# Patient Record
Sex: Female | Born: 1975 | Hispanic: No
Health system: Midwestern US, Community
[De-identification: ages and names within clinical notes are randomized; demographics above are authoritative.]

## PROBLEM LIST (undated history)

## (undated) DIAGNOSIS — I10 Essential (primary) hypertension: Secondary | ICD-10-CM

## (undated) DIAGNOSIS — T7840XA Allergy, unspecified, initial encounter: Secondary | ICD-10-CM

## (undated) DIAGNOSIS — J45909 Unspecified asthma, uncomplicated: Secondary | ICD-10-CM

## (undated) HISTORY — DX: Essential (primary) hypertension: I10

## (undated) HISTORY — DX: Unspecified asthma, uncomplicated: J45.909

## (undated) HISTORY — DX: Allergy, unspecified, initial encounter: T78.40XA

---

## 1996-12-16 HISTORY — PX: DILATION AND CURETTAGE OF UTERUS: SHX78

## 2007-02-14 HISTORY — PX: BACK SURGERY: SHX140

## 2014-05-15 HISTORY — PX: KNEE SURGERY: SHX244

## 2015-03-16 HISTORY — PX: BRANCHIAL CLEFT CYST EXCISION: SUR497

## 2016-07-29 ENCOUNTER — Encounter: Admit: 2016-07-29 | Primary: Internal Medicine

## 2016-07-29 ENCOUNTER — Inpatient Hospital Stay
Admit: 2016-07-29 | Discharge: 2016-07-29 | Disposition: A | Discharge status: Home / Self Care | Attending: Emergency Medicine

## 2016-07-29 MED ORDER — TETANUS-DIPHTH-ACELL PERTUSSIS 5-2.5-18.5 LF-MCG/0.5 IM SUSP
Freq: Once | INTRAMUSCULAR | Status: AC
Start: 2016-07-29 — End: 2016-07-29
  Administered 2016-07-29: 14:00:00 0.5 mL via INTRAMUSCULAR

## 2016-07-29 MED ORDER — NAPROXEN 500 MG PO TABS
500 MG | ORAL_TABLET | Freq: Two times a day (BID) | ORAL | 0 refills | Status: AC | PRN
Start: 2016-07-29 — End: 2016-08-08

## 2016-07-29 MED ORDER — CYCLOBENZAPRINE HCL 10 MG PO TABS
10 MG | ORAL_TABLET | Freq: Three times a day (TID) | ORAL | 0 refills | Status: AC | PRN
Start: 2016-07-29 — End: 2016-08-08

## 2016-07-29 MED FILL — BOOSTRIX 5-2.5-18.5 IM SUSP: 5-2.5-18.5 | INTRAMUSCULAR | Qty: 0.5
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
## 2021-05-22 NOTE — Progress Notes (Addendum)
[place] [facility] [place], [place], [place] [address] [phone]  Date:  05/28/2021   Name:  Sandra Hester   DOB:  10-19-1976   MRN:  [id number]  PCP:  Darreld Mclean, MD    Chief Complaint: New Patient (Initial Visit) (Concerns/ questions: /1.Referral to GYN- Dr. Mancel Bale. )   History of Present Illness:  Sandra Hester is a 45 y.o. very pleasant female patient who presents with the following:  New patient here today to establish care Referred to my practice by her mother who is also my patient Moved to this area from [place]- to be closer to her mom   She has vitamin D deficiency and she is taking vit D- feels better since taking this  Also history of insomnia  She is a safety and compliance auditor for manufacturing Her most recent labs were done in the fall  Her grandfather had colon cancer in his 77s She would like to do colonoscopy after discussion of options   She notes that her weight is a bit higher than she would like She has noted some insomnia and thinning hair  She notes that she generally only sleeps 4-5 hours a day -this has been a chronic issue for her.  She was given a prescription for Ambien in the past, but does not want to use it for fear of it being too strong  She is still menstruating with some mild irregularity She has pain and heavy bleeding during her menses She would like a referral to see Dr Mancel Bale for GYN care -I advised her that Dr. 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 Tonie Griffith Asgher is her previous PCP, will request records No reviews  Family practice physician [address]  [phone] There are no problems to display for this patient.   Past Medical History:  Diagnosis Date   Allergy    Asthma    Hypertension     Social History   Tobacco Use    Smoking status: Unknown  Vaping Use   Vaping Use: Never used  Substance Use Topics   Alcohol use: Not Currently   Drug use: Never    Family History  Problem Relation Age of Onset   Stroke Mother    Hyperlipidemia Mother    Cancer Mother    Arthritis Mother    Heart disease Father    Cancer Father    Miscarriages / Stillbirths Sister    Asthma Sister    Heart disease Son        SVT   Mental illness Son     Not on File  Medication list has been reviewed and updated.  Current Outpatient Medications on File Prior to Visit  Medication Sig Dispense Refill   BIOTIN PO Take by mouth.     Cholecalciferol (VITAMIN D3 PO) Take by mouth.     Collagen-Boron-Hyaluronic Acid (MOVE FREE ULTRA JOINT HEALTH PO) Take by mouth.     ELDERBERRY PO Take by mouth.     fluticasone (FLONASE) 50 MCG/ACT nasal spray Place into both nostrils daily.     Prenatal Vit-Fe Fumarate-FA (M-VIT) tablet Take 1 tablet by mouth daily.     vitamin C (ASCORBIC ACID) 250 MG tablet Take 250 mg by mouth daily.     No current facility-administered medications on file  prior to visit.    Review of Systems:  As per HPI- otherwise negative.   Physical Examination: Vitals:   05/28/21 1111  BP: 110/60  Pulse: 88  Resp: 18  Temp: 97.9 F (36.6 C)  SpO2: 98%   Vitals:   05/28/21 1111  Weight: 213 lb (96.6 kg)  Height: 5\' 7"  (1.702 m)   Body mass index is 33.36 kg/m. Ideal Body Weight: Weight in (lb) to have BMI = 25: 159.3  GEN: no acute distress.  Obese, otherwise looks well HEENT: Atraumatic, Normocephalic.  Bilateral TM wnl, oropharynx normal.  PEERL,EOMI.   Ears and Nose: No external deformity. CV: RRR, No M/G/R. No JVD. No thrill. No extra heart sounds. PULM: CTA B, no wheezes, crackles, rhonchi. No retractions. No resp. distress. No accessory muscle use. ABD: S, NT, ND, +BS. No rebound. No HSM. EXTR: No c/c/e PSYCH: Normally interactive. Conversant.    Assessment and Plan: Vitamin D  deficiency - Plan: VITAMIN D 25 Hydroxy (Vit-D Deficiency, Fractures)  Screening for diabetes mellitus - Plan: Comprehensive metabolic panel, Hemoglobin A1c  Screening for hyperlipidemia - Plan: Lipid panel  Screening for thyroid disorder - Plan: TSH  Screening for colon cancer - Plan: Ambulatory referral to Gastroenterology  Screening for cervical cancer - Plan: Ambulatory referral to Obstetrics / Gynecology  Screening for deficiency anemia - Plan: CBC  Primary insomnia - Plan: traZODone (DESYREL) 50 MG tablet  Menstrual irregularity - Plan: [facility]  Establishing care today She would like to try trazodone for insomnia, she will let me know how this works for Referral to GI for colonoscopy, to GYN to establish care She wonders if she may be perimenopausal, will check [facility] Will plan further follow- up pending labs.  This visit occurred during the SARS-CoV-2 public health emergency.  Safety protocols were in place, including screening questions prior to the visit, additional usage of staff PPE, and extensive cleaning of exam room while observing appropriate contact time as indicated for disinfecting solutions. ' Signed Lamar Blinks, MD  Received her labs as below, message to pt  Results for orders placed or performed in visit on 05/28/21  CBC  Result Value Ref Range   WBC 9.6 4.0 - 10.5 K/uL   RBC 5.05 3.87 - 5.11 Mil/uL   Platelets 214.0 150.0 - 400.0 K/uL   Hemoglobin 15.0 12.0 - 15.0 g/dL   HCT 43.6 36.0 - 46.0 %   MCV 86.2 78.0 - 100.0 fl   MCHC 34.4 30.0 - 36.0 g/dL   RDW 14.0 11.5 - 15.5 %  Comprehensive metabolic panel  Result Value Ref Range   Sodium 139 135 - 145 mEq/L   Potassium 4.3 3.5 - 5.1 mEq/L   Chloride 104 96 - 112 mEq/L   CO2 27 19 - 32 mEq/L   Glucose, Bld 89 70 - 99 mg/dL   BUN 10 6 - 23 mg/dL   Creatinine, Ser 0.87 0.40 - 1.20 mg/dL   Total Bilirubin 0.5 0.2 - 1.2 mg/dL   Alkaline Phosphatase 94 39 - 117 U/L   AST 12 0 - 37 U/L   ALT 12 0 - 35  U/L   Total Protein 7.0 6.0 - 8.3 g/dL   Albumin 4.4 3.5 - 5.2 g/dL   GFR 79.95 >60.00 mL/min   Calcium 9.4 8.4 - 10.5 mg/dL  Hemoglobin A1c  Result Value Ref Range   Hgb A1c MFr Bld 5.5 4.6 - 6.5 %  Lipid panel  Result Value Ref Range   Cholesterol  181 0 - 200 mg/dL   Triglycerides 192.0 (H) 0.0 - 149.0 mg/dL   HDL 35.00 (L) >39.00 mg/dL   VLDL 38.4 0.0 - 40.0 mg/dL   LDL Cholesterol 108 (H) 0 - 99 mg/dL   Total CHOL/HDL Ratio 5    NonHDL 146.32   TSH  Result Value Ref Range   TSH 3.08 0.35 - 5.50 uIU/mL  VITAMIN D 25 Hydroxy (Vit-D Deficiency, Fractures)  Result Value Ref Range   VITD 97.84 30.00 - 100.00 ng/mL  [facility]  Result Value Ref Range   FSH 4.1 mIU/ML

## 2021-05-28 ENCOUNTER — Other Ambulatory Visit: Payer: Self-pay

## 2021-05-28 ENCOUNTER — Ambulatory Visit: Payer: BC Managed Care – PPO | Admitting: Family Medicine

## 2021-05-28 ENCOUNTER — Encounter: Payer: Self-pay | Admitting: Family Medicine

## 2021-05-28 VITALS — BP 110/60 | HR 88 | Temp 97.9°F | Resp 18 | Ht 67.0 in | Wt 213.0 lb

## 2021-05-28 DIAGNOSIS — E559 Vitamin D deficiency, unspecified: Secondary | ICD-10-CM

## 2021-05-28 DIAGNOSIS — N926 Irregular menstruation, unspecified: Secondary | ICD-10-CM

## 2021-05-28 DIAGNOSIS — Z124 Encounter for screening for malignant neoplasm of cervix: Secondary | ICD-10-CM

## 2021-05-28 DIAGNOSIS — Z1329 Encounter for screening for other suspected endocrine disorder: Secondary | ICD-10-CM | POA: Diagnosis not present

## 2021-05-28 DIAGNOSIS — F5101 Primary insomnia: Secondary | ICD-10-CM

## 2021-05-28 DIAGNOSIS — Z13220 Encounter for screening for lipoid disorders: Secondary | ICD-10-CM

## 2021-05-28 DIAGNOSIS — Z130 Encounter for screening for diseases of the blood and blood-forming organs and certain disorders involving the immune mechanism: Secondary | ICD-10-CM | POA: Diagnosis not present

## 2021-05-28 DIAGNOSIS — Z1211 Encounter for screening for malignant neoplasm of colon: Secondary | ICD-10-CM

## 2021-05-28 DIAGNOSIS — Z131 Encounter for screening for diabetes mellitus: Secondary | ICD-10-CM | POA: Diagnosis not present

## 2021-05-28 LAB — TSH: TSH: 3.08 u[IU]/mL (ref 0.35–5.50)

## 2021-05-28 LAB — CBC
HCT: 43.6 % (ref 36.0–46.0)
Hemoglobin: 15 g/dL (ref 12.0–15.0)
MCHC: 34.4 g/dL (ref 30.0–36.0)
MCV: 86.2 fl (ref 78.0–100.0)
Platelets: 214 10*3/uL (ref 150.0–400.0)
RBC: 5.05 Mil/uL (ref 3.87–5.11)
RDW: 14 % (ref 11.5–15.5)
WBC: 9.6 10*3/uL (ref 4.0–10.5)

## 2021-05-28 LAB — VITAMIN D 25 HYDROXY (VIT D DEFICIENCY, FRACTURES): VITD: 97.84 ng/mL (ref 30.00–100.00)

## 2021-05-28 LAB — COMPREHENSIVE METABOLIC PANEL
ALT: 12 U/L (ref 0–35)
AST: 12 U/L (ref 0–37)
Albumin: 4.4 g/dL (ref 3.5–5.2)
Alkaline Phosphatase: 94 U/L (ref 39–117)
BUN: 10 mg/dL (ref 6–23)
CO2: 27 mEq/L (ref 19–32)
Calcium: 9.4 mg/dL (ref 8.4–10.5)
Chloride: 104 mEq/L (ref 96–112)
Creatinine, Ser: 0.87 mg/dL (ref 0.40–1.20)
GFR: 79.95 mL/min (ref >60.00)
Glucose, Bld: 89 mg/dL (ref 70–99)
Potassium: 4.3 mEq/L (ref 3.5–5.1)
Sodium: 139 mEq/L (ref 135–145)
Total Bilirubin: 0.5 mg/dL (ref 0.2–1.2)
Total Protein: 7 g/dL (ref 6.0–8.3)

## 2021-05-28 LAB — FOLLICLE STIMULATING HORMONE: FSH: 4.1 m[IU]/mL

## 2021-05-28 LAB — LIPID PANEL
Cholesterol: 181 mg/dL (ref 0–200)
HDL: 35 mg/dL — ABNORMAL LOW (ref >39.00)
LDL Cholesterol: 108 mg/dL — ABNORMAL HIGH (ref 0–99)
NonHDL: 146.32
Total CHOL/HDL Ratio: 5
Triglycerides: 192 mg/dL — ABNORMAL HIGH (ref 0.0–149.0)
VLDL: 38.4 mg/dL (ref 0.0–40.0)

## 2021-05-28 LAB — HEMOGLOBIN A1C: Hgb A1c MFr Bld: 5.5 % (ref 4.6–6.5)

## 2021-05-28 MED ORDER — TRAZODONE HCL 50 MG PO TABS: 25.0000 mg | ORAL_TABLET | Freq: Every evening | ORAL | 3 refills | Status: DC | PRN

## 2021-05-28 NOTE — Patient Instructions (Signed)
Good to meet you today- I will be in touch with your labs -referral to GYN -referral to GI to discuss colonoscopy  Try trazodone for sleep- let me know if this helps you!  Will plan further follow- up pending labs.  We will request records from Dr Asgher
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
## 2021-06-24 ENCOUNTER — Ambulatory Visit: Payer: BC Managed Care – PPO | Admitting: Family Medicine

## 2021-06-24 ENCOUNTER — Other Ambulatory Visit: Payer: Self-pay

## 2021-06-24 VITALS — BP 126/82 | HR 94 | Temp 97.4°F | Resp 16 | Ht 67.0 in | Wt 215.0 lb

## 2021-06-24 DIAGNOSIS — M26621 Arthralgia of right temporomandibular joint: Secondary | ICD-10-CM | POA: Diagnosis not present

## 2021-06-24 DIAGNOSIS — M542 Cervicalgia: Secondary | ICD-10-CM | POA: Diagnosis not present

## 2021-06-24 MED ORDER — PREDNISONE 20 MG PO TABS: ORAL_TABLET | ORAL | 0 refills | Status: DC

## 2021-06-24 NOTE — Patient Instructions (Addendum)
Good to see you today- it looks like you have TMJ pain Use the prednisone as directed- if you cannot tolerate stop using it!  Try to avoid opening your mouth too wide and stick to easy to chew, softer foods You might also try using some ice over the painful joint Let me know if you are not doing better in a week or so-Sooner if worse.

## 2021-06-30 ENCOUNTER — Encounter: Payer: Self-pay | Admitting: Gastroenterology

## 2021-07-28 ENCOUNTER — Other Ambulatory Visit: Payer: Self-pay

## 2021-07-28 ENCOUNTER — Ambulatory Visit (AMBULATORY_SURGERY_CENTER): Payer: BC Managed Care – PPO

## 2021-07-28 VITALS — Ht 67.0 in | Wt 220.0 lb

## 2021-07-28 DIAGNOSIS — Z1231 Encounter for screening mammogram for malignant neoplasm of breast: Secondary | ICD-10-CM | POA: Diagnosis not present

## 2021-07-28 DIAGNOSIS — Z124 Encounter for screening for malignant neoplasm of cervix: Secondary | ICD-10-CM | POA: Diagnosis not present

## 2021-07-28 DIAGNOSIS — N920 Excessive and frequent menstruation with regular cycle: Secondary | ICD-10-CM | POA: Diagnosis not present

## 2021-07-28 DIAGNOSIS — N926 Irregular menstruation, unspecified: Secondary | ICD-10-CM | POA: Diagnosis not present

## 2021-07-28 DIAGNOSIS — Z1211 Encounter for screening for malignant neoplasm of colon: Secondary | ICD-10-CM

## 2021-07-28 DIAGNOSIS — Z01419 Encounter for gynecological examination (general) (routine) without abnormal findings: Secondary | ICD-10-CM | POA: Diagnosis not present

## 2021-07-28 MED ORDER — SUTAB 1479-225-188 MG PO TABS: 12.0000 | ORAL_TABLET | ORAL | 0 refills | Status: DC

## 2021-07-28 NOTE — Progress Notes (Signed)

## 2021-08-06 ENCOUNTER — Encounter: Payer: Self-pay | Admitting: Gastroenterology

## 2021-08-17 ENCOUNTER — Encounter: Payer: Self-pay | Admitting: Gastroenterology

## 2021-08-17 ENCOUNTER — Ambulatory Visit: Payer: BC Managed Care – PPO | Admitting: Gastroenterology

## 2021-08-17 ENCOUNTER — Other Ambulatory Visit: Payer: Self-pay

## 2021-08-17 VITALS — BP 122/78 | HR 68 | Temp 98.6°F | Resp 13 | Ht 67.0 in | Wt 220.0 lb

## 2021-08-17 DIAGNOSIS — Z1211 Encounter for screening for malignant neoplasm of colon: Secondary | ICD-10-CM | POA: Diagnosis not present

## 2021-08-17 DIAGNOSIS — D122 Benign neoplasm of ascending colon: Secondary | ICD-10-CM

## 2021-08-17 DIAGNOSIS — D125 Benign neoplasm of sigmoid colon: Secondary | ICD-10-CM

## 2021-08-17 DIAGNOSIS — K635 Polyp of colon: Secondary | ICD-10-CM | POA: Diagnosis not present

## 2021-08-17 MED ORDER — SODIUM CHLORIDE 0.9 % IV SOLN
500.0000 mL | Freq: Once | INTRAVENOUS | Status: DC
Start: 2021-08-17 — End: 2021-08-17

## 2021-08-17 NOTE — Patient Instructions (Signed)
Handouts given for polyps and diverticulosis.  YOU HAD AN ENDOSCOPIC PROCEDURE TODAY AT THE [facility]:   Refer to the procedure report that was given to you for any specific questions about what was found during the examination.  If the procedure report does not answer your questions, please call your gastroenterologist to clarify.  If you requested that your care partner not be given the details of your procedure findings, then the procedure report has been included in a sealed envelope for you to review at your convenience later.  YOU SHOULD EXPECT: Some feelings of bloating in the abdomen. Passage of more gas than usual.  Walking can help get rid of the air that was put into your GI tract during the procedure and reduce the bloating. If you had a lower endoscopy (such as a colonoscopy or flexible sigmoidoscopy) you may notice spotting of blood in your stool or on the toilet paper. If you underwent a bowel prep for your procedure, you may not have a normal bowel movement for a few days.  Please Note:  You might notice some irritation and congestion in your nose or some drainage.  This is from the oxygen used during your procedure.  There is no need for concern and it should clear up in a day or so.  SYMPTOMS TO REPORT IMMEDIATELY:  Following lower endoscopy (colonoscopy or flexible sigmoidoscopy):  Excessive amounts of blood in the stool  Significant tenderness or worsening of abdominal pains  Swelling of the abdomen that is new, acute  Fever of 100F or higher  For urgent or emergent issues, a gastroenterologist can be reached at any hour by calling [phone]. Do not use MyChart messaging for urgent concerns.    DIET:  We do recommend a small meal at first, but then you may proceed to your regular diet.  Drink plenty of fluids but you should avoid alcoholic beverages for 24 hours.  ACTIVITY:  You should plan to take it easy for the rest of today and you should NOT DRIVE  or use heavy machinery until tomorrow (because of the sedation medicines used during the test).    FOLLOW UP: Our staff will call the number listed on your records 48-72 hours following your procedure to check on you and address any questions or concerns that you may have regarding the information given to you following your procedure. If we do not reach you, we will leave a message.  We will attempt to reach you two times.  During this call, we will ask if you have developed any symptoms of COVID 19. If you develop any symptoms (ie: fever, flu-like symptoms, shortness of breath, cough etc.) before then, please call [phone].  If you test positive for Covid 19 in the 2 weeks Gauss procedure, please call and report this information to us.    If any biopsies were taken you will be contacted by phone or by letter within the next 1-3 weeks.  Please call us at [phone] if you have not heard about the biopsies in 3 weeks.    SIGNATURES/CONFIDENTIALITY: You and/or your care partner have signed paperwork which will be entered into your electronic medical record.  These signatures attest to the fact that that the information above on your After Visit Summary has been reviewed and is understood.  Full responsibility of the confidentiality of this discharge information lies with you and/or your care-partner.  

## 2021-08-17 NOTE — Progress Notes (Signed)
Sedate, gd SR, tolerated procedure well, VSS, report to RN 

## 2021-08-17 NOTE — Progress Notes (Signed)
Referring Provider: Darreld Mclean, MD Primary Care Physician:  Darreld Mclean, MD  Reason for Procedure:  Colon cancer screening   IMPRESSION:  Need for colon cancer screening Mother with colon polyps Maternal grandfather with colon cancer Appropriate candidate for monitored anesthesia care  PLAN: Colonoscopy in the [facility] today   HPI: Sandra Hester is a 45 y.o. female presents for screening colonoscopy.  No prior colonoscopy or colon cancer screening.  No baseline GI symptoms.   Mother with colon polyps. Maternal grandfather with colon cancer in his 23s. No known family history of colon cancer or polyps. No family history of uterine/endometrial cancer, pancreatic cancer or gastric/stomach cancer.   Past Medical History:  Diagnosis Date   Allergy    Asthma    Hypertension     Past Surgical History:  Procedure Laterality Date   BACK SURGERY  02/2007   Plate and 2 screws S5/K5   BRANCHIAL CLEFT CYST EXCISION  03/2015   Two more removed: 09/2020, 03/2015   DILATION AND CURETTAGE OF UTERUS  12/1996   KNEE SURGERY Right 05/2014   Torn Meniscus    Current Outpatient Medications  Medication Sig Dispense Refill   BIOTIN PO Take by mouth.     Cholecalciferol (VITAMIN D3 PO) Take by mouth.     Collagen-Boron-Hyaluronic Acid (MOVE FREE ULTRA JOINT HEALTH PO) Take by mouth.     ELDERBERRY PO Take by mouth.     fluticasone (FLONASE) 50 MCG/ACT nasal spray Place into both nostrils daily.     Prenatal Vit-Fe Fumarate-FA (M-VIT) tablet Take 1 tablet by mouth daily.     vitamin C (ASCORBIC ACID) 250 MG tablet Take 250 mg by mouth daily.     traZODone (DESYREL) 50 MG tablet Take 0.5-1 tablets (25-50 mg total) by mouth at bedtime as needed for sleep. (Patient not taking: No sig reported) 30 tablet 3   Current Facility-Administered Medications  Medication Dose Route Frequency Provider Last Rate Last Admin   0.9 %  sodium chloride infusion  500 mL Intravenous Once Thornton Park, MD        Allergies as of 08/17/2021   (No Known Allergies)    Family History  Problem Relation Age of Onset   Stroke Mother    Hyperlipidemia Mother    Cancer Mother    Arthritis Mother    Heart disease Father    Cancer Father    6 / Stillbirths Sister    Asthma Sister    Esophageal cancer Maternal Grandmother    Colon cancer Paternal Grandfather    Heart disease Son        SVT   Mental illness Son    Colon polyps Neg Hx    Rectal cancer Neg Hx    Stomach cancer Neg Hx      Physical Exam: General:   Alert,  well-nourished, pleasant and cooperative in NAD Head:  Normocephalic and atraumatic. Eyes:  Sclera clear, no icterus.   Conjunctiva pink. Mouth:  No deformity or lesions.   Neck:  Supple; no masses or thyromegaly. Lungs:  Clear throughout to auscultation.   No wheezes. Heart:  Regular rate and rhythm; no murmurs. Abdomen:  Soft, non-tender, nondistended, normal bowel sounds, no rebound or guarding.  Msk:  Symmetrical. No boney deformities LAD: No inguinal or umbilical LAD Extremities:  No clubbing or edema. Neurologic:  Alert and  oriented x4;  grossly nonfocal Skin:  No obvious rash or bruise. Psych:  Alert and cooperative. Normal mood  and affect.     Studies/Results: No results found.    Sandra Delcastillo L. Tarri Glenn, MD, MPH 08/17/2021, 8:48 AM

## 2021-08-17 NOTE — Progress Notes (Signed)
VS by CW  I have reviewed the patient's medical history in detail and updated the computerized patient record.  

## 2021-08-17 NOTE — Op Note (Signed)
[facility] Patient Name: Sandra Hester Procedure Date: 08/17/2021 8:13 AM MRN: [id number] Endoscopist: Thornton Park MD, MD Age: 45 Referring MD:  Date of Birth: [birth year] Gender: Female Account #: [id number] Procedure:                Colonoscopy Indications:              Screening for colorectal malignant neoplasm, This                            is the patient's first colonoscopy                           Mother with colon polyps                           Maternal grandfather with colon cancer in his 54s Medicines:                Monitored Anesthesia Care Procedure:                Pre-Anesthesia Assessment:                           - Prior to the procedure, a History and Physical                            was performed, and patient medications and                            allergies were reviewed. The patient's tolerance of                            previous anesthesia was also reviewed. The risks                            and benefits of the procedure and the sedation                            options and risks were discussed with the patient.                            All questions were answered, and informed consent                            was obtained. Prior Anticoagulants: The patient has                            taken no previous anticoagulant or antiplatelet                            agents. ASA Grade Assessment: II - A patient with                            mild systemic disease. After reviewing the risks  and benefits, the patient was deemed in                            satisfactory condition to undergo the procedure.                           After obtaining informed consent, the colonoscope                            was passed under direct vision. Throughout the                            procedure, the patient's blood pressure, pulse, and                            oxygen saturations were monitored continuously. The                             CF [id number] #[id number] was introduced through the anus                            and advanced to the the terminal ileum. A second                            forward view of the right colon was performed. The                            colonoscopy was performed with moderate difficulty                            due to significant looping. Successful completion                            of the procedure was aided by applying abdominal                            pressure. The patient tolerated the procedure well.                            The quality of the bowel preparation was excellent.                            The terminal ileum, ileocecal valve, appendiceal                            orifice, and rectum were photographed. Scope In: 8:22:45 AM Scope Out: 8:36:56 AM Scope Withdrawal Time: 0 hours 10 minutes 28 seconds  Total Procedure Duration: 0 hours 14 minutes 11 seconds  Findings:                 Hemorrhoids were found on perianal exam.                           Many small and large-mouthed diverticula were found  in the sigmoid colon and descending colon.                           A 3 mm polyp was found in the sigmoid colon. The                            polyp was flat. The polyp was removed with a cold                            snare. Resection and retrieval were complete.                            Estimated blood loss was minimal.                           Four sessile polyps were found in the ascending                            colon. The polyps were 2 to 8 mm in size. These                            polyps were removed with a cold snare. Resection                            and retrieval were complete. Estimated blood loss                            was minimal.                           The exam was otherwise without abnormality on                            direct and retroflexion views. Complications:            No  immediate complications. Estimated blood loss:                            Minimal. Estimated Blood Loss:     Estimated blood loss was minimal. Impression:               - Hemorrhoids found on perianal exam.                           - Diverticulosis in the sigmoid colon and in the                            descending colon.                           - One 3 mm polyp in the sigmoid colon, removed with                            a cold snare. Resected and retrieved.                           -  Four 2 to 8 mm polyps in the ascending colon,                            removed with a cold snare. Resected and retrieved.                           - The examination was otherwise normal on direct                            and retroflexion views. Recommendation:           - Patient has a contact number available for                            emergencies. The signs and symptoms of potential                            delayed complications were discussed with the                            patient. Return to normal activities tomorrow.                            Written discharge instructions were provided to the                            patient.                           - Resume previous diet.                           - Continue present medications.                           - Await pathology results.                           - Repeat colonoscopy date to be determined after                            pending pathology results are reviewed for                            surveillance.                           - Emerging evidence supports eating a diet of                            fruits, vegetables, grains, calcium, and yogurt                            while reducing red meat and alcohol may reduce the  risk of colon cancer.                           - Thank you for allowing me to be involved in your                            colon cancer prevention. Thornton Park MD, MD 08/17/2021 8:44:55 AM This report has been signed electronically.

## 2021-08-18 ENCOUNTER — Other Ambulatory Visit: Payer: Self-pay | Admitting: Obstetrics and Gynecology

## 2021-08-18 DIAGNOSIS — N920 Excessive and frequent menstruation with regular cycle: Secondary | ICD-10-CM | POA: Diagnosis not present

## 2021-08-19 ENCOUNTER — Telehealth: Payer: Self-pay

## 2021-08-19 NOTE — Telephone Encounter (Signed)
  Follow up Call-  Call back number 08/17/2021  [facility] procedure Call Back phone  # [phone]  Permission to leave phone message Yes     Patient questions:  Do you have a fever, pain , or abdominal swelling? No. Pain Score  0 *  Have you tolerated food without any problems? Yes.    Have you been able to return to your normal activities? Yes.    Do you have any questions about your discharge instructions: Diet   No. Medications  No. Follow up visit  No.  Do you have questions or concerns about your Care? No.  Actions: * If pain score is 4 or above: No action needed, pain <4.

## 2021-08-20 ENCOUNTER — Encounter: Payer: Self-pay | Admitting: Gastroenterology

## 2021-09-02 DIAGNOSIS — N920 Excessive and frequent menstruation with regular cycle: Secondary | ICD-10-CM | POA: Diagnosis not present

## 2021-09-02 DIAGNOSIS — N946 Dysmenorrhea, unspecified: Secondary | ICD-10-CM | POA: Diagnosis not present

## 2021-09-02 DIAGNOSIS — D252 Subserosal leiomyoma of uterus: Secondary | ICD-10-CM | POA: Diagnosis not present

## 2021-11-04 DIAGNOSIS — J209 Acute bronchitis, unspecified: Secondary | ICD-10-CM | POA: Diagnosis not present

## 2021-11-04 DIAGNOSIS — Z20822 Contact with and (suspected) exposure to covid-19: Secondary | ICD-10-CM | POA: Diagnosis not present

## 2021-11-16 ENCOUNTER — Encounter: Payer: Self-pay | Admitting: Family Medicine

## 2021-11-16 DIAGNOSIS — G8929 Other chronic pain: Secondary | ICD-10-CM

## 2021-11-19 ENCOUNTER — Other Ambulatory Visit: Payer: Self-pay | Admitting: Family

## 2021-11-19 ENCOUNTER — Encounter: Payer: Self-pay | Admitting: Family

## 2021-11-19 ENCOUNTER — Ambulatory Visit (INDEPENDENT_AMBULATORY_CARE_PROVIDER_SITE_OTHER)
Admission: RE | Admit: 2021-11-19 | Discharge: 2021-11-19 | Disposition: A | Discharge status: Home / Self Care | Payer: BC Managed Care – PPO | Source: Ambulatory Visit | Attending: Family | Admitting: Family

## 2021-11-19 ENCOUNTER — Ambulatory Visit: Payer: BC Managed Care – PPO | Admitting: Family

## 2021-11-19 ENCOUNTER — Other Ambulatory Visit: Payer: Self-pay

## 2021-11-19 VITALS — BP 122/100 | HR 83 | Temp 98.1°F | Ht 67.0 in | Wt 219.0 lb

## 2021-11-19 DIAGNOSIS — M2578 Osteophyte, vertebrae: Secondary | ICD-10-CM | POA: Diagnosis not present

## 2021-11-19 DIAGNOSIS — M545 Low back pain, unspecified: Secondary | ICD-10-CM

## 2021-11-19 MED ORDER — METHOCARBAMOL 500 MG PO TABS: 500.0000 mg | ORAL_TABLET | Freq: Every evening | ORAL | 0 refills | Status: DC | PRN

## 2021-11-19 MED ORDER — METHYLPREDNISOLONE ACETATE 40 MG/ML IJ SUSP
40.0000 mg | Freq: Once | INTRAMUSCULAR | Status: AC
  Administered 2021-11-19: 40 mg via INTRAMUSCULAR

## 2021-11-19 MED ORDER — MELOXICAM 15 MG PO TABS: 15.0000 mg | ORAL_TABLET | Freq: Every day | ORAL | 0 refills | Status: DC

## 2021-11-19 NOTE — Progress Notes (Signed)
Sandra Hester is a 46 y.o. female with the following history as recorded in Epic[employer]:  There are no problems to display for this patient.   Current Outpatient Medications  Medication Sig Dispense Refill   BIOTIN PO Take by mouth.     Cholecalciferol (VITAMIN D3 PO) Take by mouth.     Collagen-Boron-Hyaluronic Acid (MOVE FREE ULTRA JOINT HEALTH PO) Take by mouth.     ELDERBERRY PO Take by mouth.     fluticasone (FLONASE) 50 MCG/ACT nasal spray Place into both nostrils daily.     Prenatal Vit-Fe Fumarate-FA (M-VIT) tablet Take 1 tablet by mouth daily.     traZODone (DESYREL) 50 MG tablet Take 0.5-1 tablets (25-50 mg total) by mouth at bedtime as needed for sleep. 30 tablet 3   vitamin C (ASCORBIC ACID) 250 MG tablet Take 250 mg by mouth daily.     albuterol (VENTOLIN HFA) 108 (90 Base) MCG/ACT inhaler Inhale 2 puffs into the lungs every 4 (four) hours as needed.     benzonatate (TESSALON) 200 MG capsule Take by mouth.     Current Facility-Administered Medications  Medication Dose Route Frequency Provider Last Rate Last Admin   methylPREDNISolone acetate (DEPO-MEDROL) injection 40 mg  40 mg Intramuscular Once Marrian Salvage, FNP        Allergies: Patient has no known allergies.  Past Medical History:  Diagnosis Date   Allergy    Asthma    Hypertension     Past Surgical History:  Procedure Laterality Date   BACK SURGERY  02/2007   Plate and 2 screws Z6/X0   BRANCHIAL CLEFT CYST EXCISION  03/2015   Two more removed: 09/2020, 03/2015   DILATION AND CURETTAGE OF UTERUS  12/1996   KNEE SURGERY Right 05/2014   Torn Meniscus    Family History  Problem Relation Age of Onset   Stroke Mother    Hyperlipidemia Mother    Cancer Mother    Arthritis Mother    Heart disease Father    Cancer Father    52 / Stillbirths Sister    Asthma Sister    Esophageal cancer Maternal Grandmother    Colon cancer Paternal Grandfather    Heart disease Son        SVT   Mental illness  Son    Colon polyps Neg Hx    Rectal cancer Neg Hx    Stomach cancer Neg Hx     Social History   Tobacco Use   Smoking status: Every Day    Types: Cigarettes   Smokeless tobacco: Never  Substance Use Topics   Alcohol use: Not Currently    Subjective:   Complaining of back pain that started 3 days ago while bending over; notes that pain is localized to area of her back - denies any numbness or tingling radiating into her lower extremities.  Has high pain tolerance; has been using OTC Advil with limited relief; no concerns for changes in bowel/ bladder habits; does feel that symptoms have improved slightly in past 24 hours;   LMP 11/19/2021  Objective:  Vitals:   11/19/21 1302  BP: (!) 122/100  Pulse: 83  Temp: 98.1 F (36.7 C)  TempSrc: Oral  SpO2: 96%  Weight: 219 lb (99.3 kg)  Height: 5\' 7"  (1.702 m)    General: Well developed, well nourished, in no acute distress  Skin : Warm and dry.  Head: Normocephalic and atraumatic  Eyes: Sclera and conjunctiva clear; pupils round and reactive to light;  extraocular movements intact  Ears: External normal; canals clear; tympanic membranes normal  Oropharynx: Pink, supple. No suspicious lesions  Neck: Supple without thyromegaly, adenopathy  Lungs: Respirations unlabored;  Musculoskeletal: No deformities; no active joint inflammation  Extremities: No edema, cyanosis, clubbing  Vessels: Symmetric bilaterally  Neurologic: Alert and oriented; speech intact; face symmetrical; moves all extremities well; CNII-XII intact without focal deficit   Assessment:  1. Acute right-sided low back pain without sciatica     Plan:  Update lumbar X-ray; Depo-Medrol IM 40 mg daily; assuming X-ray is normal, will plan to call in Mobic and Robaxin for patient; to consider sports medicine referral as well;   This visit occurred during the SARS-CoV-2 public health emergency.  Safety protocols were in place, including screening questions prior to the  visit, additional usage of staff PPE, and extensive cleaning of exam room while observing appropriate contact time as indicated for disinfecting solutions.    No follow-ups on file.  Orders Placed This Encounter  Procedures   DG Lumbar Spine Complete    Standing Status:   Future    Standing Expiration Date:   11/19/2022    Order Specific Question:   Reason for Exam (SYMPTOM  OR DIAGNOSIS REQUIRED)    Answer:   low back pain    Order Specific Question:   Is patient pregnant?    Answer:   No    Order Specific Question:   Preferred imaging location?    Answer:   [facility]    Requested Prescriptions    No prescriptions requested or ordered in this encounter

## 2021-11-19 NOTE — Patient Instructions (Addendum)
Please go to [place], [place] to get your X-ray done; they are open from 8 am - 5 pm

## 2021-11-20 ENCOUNTER — Other Ambulatory Visit: Payer: Self-pay | Admitting: Family

## 2021-11-20 DIAGNOSIS — G8929 Other chronic pain: Secondary | ICD-10-CM

## 2021-12-08 NOTE — Addendum Note (Signed)
Addended by: Lamar Blinks C on: 12/08/2021 02:28 PM   Modules accepted: Orders

## 2022-02-14 ENCOUNTER — Other Ambulatory Visit: Payer: Self-pay | Admitting: Family

## 2022-03-09 DIAGNOSIS — M5451 Vertebrogenic low back pain: Secondary | ICD-10-CM | POA: Diagnosis not present

## 2022-03-29 DIAGNOSIS — M5451 Vertebrogenic low back pain: Secondary | ICD-10-CM | POA: Diagnosis not present

## 2022-03-29 DIAGNOSIS — M5431 Sciatica, right side: Secondary | ICD-10-CM | POA: Diagnosis not present

## 2022-03-29 DIAGNOSIS — M2560 Stiffness of unspecified joint, not elsewhere classified: Secondary | ICD-10-CM | POA: Diagnosis not present

## 2022-03-29 DIAGNOSIS — M6281 Muscle weakness (generalized): Secondary | ICD-10-CM | POA: Diagnosis not present

## 2022-04-02 DIAGNOSIS — M6281 Muscle weakness (generalized): Secondary | ICD-10-CM | POA: Diagnosis not present

## 2022-04-02 DIAGNOSIS — M2560 Stiffness of unspecified joint, not elsewhere classified: Secondary | ICD-10-CM | POA: Diagnosis not present

## 2022-04-02 DIAGNOSIS — M5451 Vertebrogenic low back pain: Secondary | ICD-10-CM | POA: Diagnosis not present

## 2022-04-02 DIAGNOSIS — M5431 Sciatica, right side: Secondary | ICD-10-CM | POA: Diagnosis not present

## 2022-04-23 DIAGNOSIS — M5431 Sciatica, right side: Secondary | ICD-10-CM | POA: Diagnosis not present

## 2022-04-23 DIAGNOSIS — M5451 Vertebrogenic low back pain: Secondary | ICD-10-CM | POA: Diagnosis not present

## 2022-04-23 DIAGNOSIS — M2560 Stiffness of unspecified joint, not elsewhere classified: Secondary | ICD-10-CM | POA: Diagnosis not present

## 2022-04-23 DIAGNOSIS — M6281 Muscle weakness (generalized): Secondary | ICD-10-CM | POA: Diagnosis not present

## 2022-04-26 DIAGNOSIS — M5431 Sciatica, right side: Secondary | ICD-10-CM | POA: Diagnosis not present

## 2022-04-26 DIAGNOSIS — M2560 Stiffness of unspecified joint, not elsewhere classified: Secondary | ICD-10-CM | POA: Diagnosis not present

## 2022-04-26 DIAGNOSIS — M5451 Vertebrogenic low back pain: Secondary | ICD-10-CM | POA: Diagnosis not present

## 2022-04-26 DIAGNOSIS — M6281 Muscle weakness (generalized): Secondary | ICD-10-CM | POA: Diagnosis not present

## 2022-05-17 DIAGNOSIS — M6281 Muscle weakness (generalized): Secondary | ICD-10-CM | POA: Diagnosis not present

## 2022-05-17 DIAGNOSIS — M2560 Stiffness of unspecified joint, not elsewhere classified: Secondary | ICD-10-CM | POA: Diagnosis not present

## 2022-05-17 DIAGNOSIS — M5451 Vertebrogenic low back pain: Secondary | ICD-10-CM | POA: Diagnosis not present

## 2022-05-17 DIAGNOSIS — M5431 Sciatica, right side: Secondary | ICD-10-CM | POA: Diagnosis not present

## 2022-05-19 DIAGNOSIS — M5451 Vertebrogenic low back pain: Secondary | ICD-10-CM | POA: Diagnosis not present

## 2022-05-19 DIAGNOSIS — M6281 Muscle weakness (generalized): Secondary | ICD-10-CM | POA: Diagnosis not present

## 2022-05-19 DIAGNOSIS — M5431 Sciatica, right side: Secondary | ICD-10-CM | POA: Diagnosis not present

## 2022-05-19 DIAGNOSIS — M2560 Stiffness of unspecified joint, not elsewhere classified: Secondary | ICD-10-CM | POA: Diagnosis not present

## 2022-05-25 DIAGNOSIS — M6281 Muscle weakness (generalized): Secondary | ICD-10-CM | POA: Diagnosis not present

## 2022-05-25 DIAGNOSIS — M5431 Sciatica, right side: Secondary | ICD-10-CM | POA: Diagnosis not present

## 2022-05-25 DIAGNOSIS — M5451 Vertebrogenic low back pain: Secondary | ICD-10-CM | POA: Diagnosis not present

## 2022-05-25 DIAGNOSIS — M2560 Stiffness of unspecified joint, not elsewhere classified: Secondary | ICD-10-CM | POA: Diagnosis not present

## 2022-06-21 DIAGNOSIS — M5451 Vertebrogenic low back pain: Secondary | ICD-10-CM | POA: Diagnosis not present

## 2022-06-21 DIAGNOSIS — M5431 Sciatica, right side: Secondary | ICD-10-CM | POA: Diagnosis not present

## 2022-06-21 DIAGNOSIS — M6281 Muscle weakness (generalized): Secondary | ICD-10-CM | POA: Diagnosis not present

## 2022-06-21 DIAGNOSIS — M2560 Stiffness of unspecified joint, not elsewhere classified: Secondary | ICD-10-CM | POA: Diagnosis not present

## 2022-06-23 DIAGNOSIS — M6281 Muscle weakness (generalized): Secondary | ICD-10-CM | POA: Diagnosis not present

## 2022-06-23 DIAGNOSIS — M2560 Stiffness of unspecified joint, not elsewhere classified: Secondary | ICD-10-CM | POA: Diagnosis not present

## 2022-06-23 DIAGNOSIS — M5431 Sciatica, right side: Secondary | ICD-10-CM | POA: Diagnosis not present

## 2022-06-23 DIAGNOSIS — M5451 Vertebrogenic low back pain: Secondary | ICD-10-CM | POA: Diagnosis not present

## 2022-07-05 DIAGNOSIS — M5451 Vertebrogenic low back pain: Secondary | ICD-10-CM | POA: Diagnosis not present

## 2022-07-05 DIAGNOSIS — M5431 Sciatica, right side: Secondary | ICD-10-CM | POA: Diagnosis not present

## 2022-07-05 DIAGNOSIS — M6281 Muscle weakness (generalized): Secondary | ICD-10-CM | POA: Diagnosis not present

## 2022-07-05 DIAGNOSIS — M2560 Stiffness of unspecified joint, not elsewhere classified: Secondary | ICD-10-CM | POA: Diagnosis not present

## 2022-07-14 DIAGNOSIS — M6281 Muscle weakness (generalized): Secondary | ICD-10-CM | POA: Diagnosis not present

## 2022-07-14 DIAGNOSIS — M2560 Stiffness of unspecified joint, not elsewhere classified: Secondary | ICD-10-CM | POA: Diagnosis not present

## 2022-07-14 DIAGNOSIS — M5451 Vertebrogenic low back pain: Secondary | ICD-10-CM | POA: Diagnosis not present

## 2022-07-14 DIAGNOSIS — M5431 Sciatica, right side: Secondary | ICD-10-CM | POA: Diagnosis not present

## 2022-07-16 DIAGNOSIS — M2560 Stiffness of unspecified joint, not elsewhere classified: Secondary | ICD-10-CM | POA: Diagnosis not present

## 2022-07-16 DIAGNOSIS — M5431 Sciatica, right side: Secondary | ICD-10-CM | POA: Diagnosis not present

## 2022-07-16 DIAGNOSIS — M5451 Vertebrogenic low back pain: Secondary | ICD-10-CM | POA: Diagnosis not present

## 2022-07-16 DIAGNOSIS — M6281 Muscle weakness (generalized): Secondary | ICD-10-CM | POA: Diagnosis not present

## 2022-07-19 NOTE — Patient Instructions (Addendum)
It was great to see you again today, I will be in touch with your labs Flu shot today Recommend a covid booster this fall Lets have you start on wellbutrin 150 mg daily - this may help you quit smoking

## 2022-07-19 NOTE — Progress Notes (Signed)
[place] at [facility] [place], [place], [place] [address] [phone]  Date:  07/22/2022   Name:  Sandra Hester   DOB:  [birth year]   MRN:  [id number]  PCP:  Darreld Mclean, MD    Chief Complaint: Annual Exam (Concerns/ questions: none/Pap: sees Dr Mancel Bale, scheduled Oct 31/Hep C/ HIV Screen due today. )   History of Present Illness:  Sandra Hester is a 46 y.o. very pleasant female patient who presents with the following:  Patient seen today for physical exam Most recent visit with myself was about 1 year ago-she had recently moved from [place] to be closer to her mother History of insomnia, otherwise generally in good health  She is seeing gynecology, Dr. Everett Graff Pap screening- per her GYN Mammogram- per her GYN Colon cancer screening-2022- recheck in 3 years  Can check hepatitis C Can update routine labs today  She is visiting [place] and [place] next year;  She wonders if she would need any particular as far as immunizations.  I advised her that no particular immunizations are needed, but I would suggest getting a COVID booster if indicated before her trip next year  She notes difficulty breathing and chest pain when she is supine on her back/when laying in bed for 3-4 months; sleeping on her side is ok She is really not coughing wise in particular She does smoke about 1- 1.5 PPD; she has smoked since she was 64- 61 yo Give flu shot today   No CP with physical activity- walking, stairs etc She tries to exercise; she is doing PT for her back but does not have a regular cardio program otherwise  She most recently had CP 2 days ago when supine at night- lasted 10- 15 minutes Sitting up seemed to help She is not sure if this might be gerd-she has not noted any other GERD symptoms There are no problems to display for this patient.   Past Medical History:  Diagnosis Date   Allergy    Asthma    Hypertension     Past  Surgical History:  Procedure Laterality Date   BACK SURGERY  02/2007   Plate and 2 screws T2/P4   BRANCHIAL CLEFT CYST EXCISION  03/2015   Two more removed: 09/2020, 03/2015   DILATION AND CURETTAGE OF UTERUS  12/1996   KNEE SURGERY Right 05/2014   Torn Meniscus    Social History   Tobacco Use   Smoking status: Every Day    Types: Cigarettes   Smokeless tobacco: Never  Vaping Use   Vaping Use: Never used  Substance Use Topics   Alcohol use: Not Currently   Drug use: Never    Family History  Problem Relation Age of Onset   Stroke Mother    Hyperlipidemia Mother    Cancer Mother    Arthritis Mother    Heart disease Father    Cancer Father    Miscarriages / Stillbirths Sister    Asthma Sister    Esophageal cancer Maternal Grandmother    Colon cancer Paternal Grandfather    Heart disease Son        SVT   Mental illness Son    Colon polyps Neg Hx    Rectal cancer Neg Hx    Stomach cancer Neg Hx     No Known Allergies  Medication list has been reviewed and updated.  Current Outpatient Medications on File Prior to Visit  Medication Sig Dispense Refill   albuterol (VENTOLIN HFA) 108 (90 Base) MCG/ACT inhaler Inhale 2 puffs into the lungs every 4 (four) hours as needed.     benzonatate (TESSALON) 200 MG capsule Take by mouth.     Cholecalciferol (VITAMIN D3 PO) Take by mouth.     Collagen-Boron-Hyaluronic Acid (MOVE FREE ULTRA JOINT HEALTH PO) Take by mouth.     ELDERBERRY PO Take by mouth.     fluticasone (FLONASE) 50 MCG/ACT nasal spray Place into both nostrils daily.     meloxicam (MOBIC) 15 MG tablet TAKE 1 TABLET(15 MG) BY MOUTH DAILY 90 tablet 0   Prenatal Vit-Fe Fumarate-FA (M-VIT) tablet Take 1 tablet by mouth daily.     traZODone (DESYREL) 50 MG tablet Take 0.5-1 tablets (25-50 mg total) by mouth at bedtime as needed for sleep. 30 tablet 3   vitamin C (ASCORBIC ACID) 250 MG tablet Take 250 mg by mouth daily.     No current facility-administered medications  on file prior to visit.    Review of Systems:  As per HPI- otherwise negative.   Physical Examination: Vitals:   07/22/22 1009  BP: 118/80  Pulse: 78  Resp: 18  Temp: 97.8 F (36.6 C)  SpO2: 99%   Vitals:   07/22/22 1009  Weight: 214 lb 9.6 oz (97.3 kg)  Height: '5\' 7"'$  (1.702 m)   Body mass index is 33.61 kg/m. Ideal Body Weight: Weight in (lb) to have BMI = 25: 159.3  GEN: no acute distress.  Mildly obese, looks well HEENT: Atraumatic, Normocephalic.  Bilateral TM wnl, oropharynx normal.  PEERL,EOMI.   Ears and Nose: No external deformity. CV: RRR, No M/G/R. No JVD. No thrill. No extra heart sounds. PULM: CTA B, no wheezes, crackles, rhonchi. No retractions. No resp. distress. No accessory muscle use. ABD: S, NT, ND, +BS. No rebound. No HSM. EXTR: No c/c/e PSYCH: Normally interactive. Conversant.   EKG today.  No old tracing for comparison.  Sinus rhythm, no acute findings.  Possible left atrial enlargement Assessment and Plan: Physical exam - Plan: Flu Vaccine QUAD 6+ mos PF IM (Fluarix Quad PF)  Screening for thyroid disorder - Plan: TSH  Screening for diabetes mellitus - Plan: Comprehensive metabolic panel, Hemoglobin A1c  Vitamin D deficiency - Plan: VITAMIN D 25 Hydroxy (Vit-D Deficiency, Fractures)  Screening for deficiency anemia - Plan: CBC  Screening for hyperlipidemia - Plan: Lipid panel  Chest pain, unspecified type - Plan: DG Chest 2 View, EKG 12-Lead, CANCELED: EKG 12-Lead  Orthopnea - Plan: B Nat Peptide  Palpitations - Plan: EKG 12-Lead  Gastroesophageal reflux disease, unspecified whether esophagitis present - Plan: pantoprazole (PROTONIX) 40 MG tablet  Tobacco use - Plan: buPROPion (WELLBUTRIN XL) 150 MG 24 hr tablet  Need for vaccination - Plan: Flu Vaccine QUAD 6+ mos PF IM (Fluarix Quad PF)  Patient seen today for physical exam.  Encouraged healthy diet and exercise routine Will plan further follow- up pending labs. She is interested  in quitting smoking.  However, she did not tolerate Chantix.  We decided to try Wellbutrin ER 150, she will let me know how this works for her.  No history of seizures She has noted some difficulty breathing and chest discomfort when supine. Pathology such as CHF or CAD less likely given her age, but we will go ahead and obtain a chest x-ray and a BNP.  GERD may be contributing, we will have her try a proton pump inhibitor for 2 to 4 weeks.  We can consider further cardiac evaluation such as an echo or CT coronary if she would like.  A Zio patch is problematic as she is allergic to adhesives  Signed Lamar Blinks, MD  Received results as below, message to patient Results for orders placed or performed in visit on 07/22/22  CBC  Result Value Ref Range   WBC 9.6 4.0 - 10.5 K/uL   RBC 5.02 3.87 - 5.11 Mil/uL   Platelets 229.0 150.0 - 400.0 K/uL   Hemoglobin 14.9 12.0 - 15.0 g/dL   HCT 44.3 36.0 - 46.0 %   MCV 88.3 78.0 - 100.0 fl   MCHC 33.7 30.0 - 36.0 g/dL   RDW 13.6 11.5 - 15.5 %  Comprehensive metabolic panel  Result Value Ref Range   Sodium 139 135 - 145 mEq/L   Potassium 4.6 3.5 - 5.1 mEq/L   Chloride 105 96 - 112 mEq/L   CO2 26 19 - 32 mEq/L   Glucose, Bld 89 70 - 99 mg/dL   BUN 12 6 - 23 mg/dL   Creatinine, Ser 0.83 0.40 - 1.20 mg/dL   Total Bilirubin 0.4 0.2 - 1.2 mg/dL   Alkaline Phosphatase 107 39 - 117 U/L   AST 12 0 - 37 U/L   ALT 13 0 - 35 U/L   Total Protein 7.4 6.0 - 8.3 g/dL   Albumin 4.4 3.5 - 5.2 g/dL   GFR 83.91 >60.00 mL/min   Calcium 9.4 8.4 - 10.5 mg/dL  Hemoglobin A1c  Result Value Ref Range   Hgb A1c MFr Bld 5.7 4.6 - 6.5 %  Lipid panel  Result Value Ref Range   Cholesterol 164 0 - 200 mg/dL   Triglycerides 170.0 (H) 0.0 - 149.0 mg/dL   HDL 33.50 (L) >39.00 mg/dL   VLDL 34.0 0.0 - 40.0 mg/dL   LDL Cholesterol 96 0 - 99 mg/dL   Total CHOL/HDL Ratio 5    NonHDL 130.12   TSH  Result Value Ref Range   TSH 2.39 0.35 - 5.50 uIU/mL  VITAMIN D 25  Hydroxy (Vit-D Deficiency, Fractures)  Result Value Ref Range   VITD 75.61 30.00 - 100.00 ng/mL  B Nat Peptide  Result Value Ref Range   Pro B Natriuretic peptide (BNP) 8.0 0.0 - 100.0 pg/mL    DG Chest 2 View  Result Date: 07/22/2022 CLINICAL DATA:  Shortness of breath when lying down for 2 months. EXAM: CHEST - 2 VIEW COMPARISON:  None Available. FINDINGS: The heart size and mediastinal contours are within normal limits. Both lungs are clear. No pleural effusion or pneumothorax. The visualized skeletal structures are unremarkable. IMPRESSION: No acute cardiopulmonary abnormality. Electronically Signed   By: Ileana Roup M.D.   On: 07/22/2022 11:42

## 2022-07-22 ENCOUNTER — Encounter: Payer: Self-pay | Admitting: Family Medicine

## 2022-07-22 ENCOUNTER — Ambulatory Visit (INDEPENDENT_AMBULATORY_CARE_PROVIDER_SITE_OTHER): Payer: BC Managed Care – PPO | Admitting: Family Medicine

## 2022-07-22 ENCOUNTER — Ambulatory Visit (HOSPITAL_BASED_OUTPATIENT_CLINIC_OR_DEPARTMENT_OTHER)
Admission: RE | Admit: 2022-07-22 | Discharge: 2022-07-22 | Disposition: A | Discharge status: Home / Self Care | Payer: BC Managed Care – PPO | Source: Ambulatory Visit | Attending: Family Medicine | Admitting: Family Medicine

## 2022-07-22 VITALS — BP 118/80 | HR 78 | Temp 97.8°F | Resp 18 | Ht 67.0 in | Wt 214.6 lb

## 2022-07-22 DIAGNOSIS — R0601 Orthopnea: Secondary | ICD-10-CM

## 2022-07-22 DIAGNOSIS — E559 Vitamin D deficiency, unspecified: Secondary | ICD-10-CM

## 2022-07-22 DIAGNOSIS — R079 Chest pain, unspecified: Secondary | ICD-10-CM | POA: Insufficient documentation

## 2022-07-22 DIAGNOSIS — Z720 Tobacco use: Secondary | ICD-10-CM

## 2022-07-22 DIAGNOSIS — K219 Gastro-esophageal reflux disease without esophagitis: Secondary | ICD-10-CM

## 2022-07-22 DIAGNOSIS — Z23 Encounter for immunization: Secondary | ICD-10-CM

## 2022-07-22 DIAGNOSIS — Z0000 Encounter for general adult medical examination without abnormal findings: Secondary | ICD-10-CM

## 2022-07-22 DIAGNOSIS — R002 Palpitations: Secondary | ICD-10-CM

## 2022-07-22 DIAGNOSIS — Z130 Encounter for screening for diseases of the blood and blood-forming organs and certain disorders involving the immune mechanism: Secondary | ICD-10-CM

## 2022-07-22 DIAGNOSIS — Z1329 Encounter for screening for other suspected endocrine disorder: Secondary | ICD-10-CM | POA: Diagnosis not present

## 2022-07-22 DIAGNOSIS — R0602 Shortness of breath: Secondary | ICD-10-CM | POA: Diagnosis not present

## 2022-07-22 DIAGNOSIS — Z13220 Encounter for screening for lipoid disorders: Secondary | ICD-10-CM

## 2022-07-22 DIAGNOSIS — Z131 Encounter for screening for diabetes mellitus: Secondary | ICD-10-CM

## 2022-07-22 LAB — COMPREHENSIVE METABOLIC PANEL
ALT: 13 U/L (ref 0–35)
AST: 12 U/L (ref 0–37)
Albumin: 4.4 g/dL (ref 3.5–5.2)
Alkaline Phosphatase: 107 U/L (ref 39–117)
BUN: 12 mg/dL (ref 6–23)
CO2: 26 mEq/L (ref 19–32)
Calcium: 9.4 mg/dL (ref 8.4–10.5)
Chloride: 105 mEq/L (ref 96–112)
Creatinine, Ser: 0.83 mg/dL (ref 0.40–1.20)
GFR: 83.91 mL/min (ref >60.00)
Glucose, Bld: 89 mg/dL (ref 70–99)
Potassium: 4.6 mEq/L (ref 3.5–5.1)
Sodium: 139 mEq/L (ref 135–145)
Total Bilirubin: 0.4 mg/dL (ref 0.2–1.2)
Total Protein: 7.4 g/dL (ref 6.0–8.3)

## 2022-07-22 LAB — CBC
HCT: 44.3 % (ref 36.0–46.0)
Hemoglobin: 14.9 g/dL (ref 12.0–15.0)
MCHC: 33.7 g/dL (ref 30.0–36.0)
MCV: 88.3 fl (ref 78.0–100.0)
Platelets: 229 10*3/uL (ref 150.0–400.0)
RBC: 5.02 Mil/uL (ref 3.87–5.11)
RDW: 13.6 % (ref 11.5–15.5)
WBC: 9.6 10*3/uL (ref 4.0–10.5)

## 2022-07-22 LAB — LIPID PANEL
Cholesterol: 164 mg/dL (ref 0–200)
HDL: 33.5 mg/dL — ABNORMAL LOW (ref >39.00)
LDL Cholesterol: 96 mg/dL (ref 0–99)
NonHDL: 130.12
Total CHOL/HDL Ratio: 5
Triglycerides: 170 mg/dL — ABNORMAL HIGH (ref 0.0–149.0)
VLDL: 34 mg/dL (ref 0.0–40.0)

## 2022-07-22 LAB — TSH: TSH: 2.39 u[IU]/mL (ref 0.35–5.50)

## 2022-07-22 LAB — HEMOGLOBIN A1C: Hgb A1c MFr Bld: 5.7 % (ref 4.6–6.5)

## 2022-07-22 LAB — BRAIN NATRIURETIC PEPTIDE: Pro B Natriuretic peptide (BNP): 8 pg/mL (ref 0.0–100.0)

## 2022-07-22 LAB — VITAMIN D 25 HYDROXY (VIT D DEFICIENCY, FRACTURES): VITD: 75.61 ng/mL (ref 30.00–100.00)

## 2022-07-22 MED ORDER — PANTOPRAZOLE SODIUM 40 MG PO TBEC: 40.0000 mg | DELAYED_RELEASE_TABLET | Freq: Every day | ORAL | 3 refills | Status: DC

## 2022-07-22 MED ORDER — BUPROPION HCL ER (XL) 150 MG PO TB24: 150.0000 mg | ORAL_TABLET | Freq: Every day | ORAL | 3 refills | Status: DC

## 2022-09-22 DIAGNOSIS — Z1231 Encounter for screening mammogram for malignant neoplasm of breast: Secondary | ICD-10-CM | POA: Diagnosis not present

## 2022-09-23 NOTE — Progress Notes (Signed)
[place] at [facility] [address], [place], [place] [address] [phone]  Date:  09/27/2022   Name:  Sandra Hester   DOB:  December 23, 1975   MRN:  [id number]  PCP:  Darreld Mclean, MD    Chief Complaint: Shoulder Injury (Can't move in certain directions, lifting is painful (few months ago). Not taking any tx. )   History of Present Illness:  Sandra Hester is a 46 y.o. very pleasant female patient who presents with the following:  Pt seen today with concern of a shoulder injury Last visit with myself in September  She notes a problem with her right shoulder She has a car that is very hard to steer- she was trying to turn left with just her right arm on the wheel.  She felt a pull in her right shoulder Seemed like it was not a big deal, but has just continued to hurt esp if she uses her arm more  It does ache at night- she cannot sleep with her arm over her head like she normally does Crossing her arm over her body or abducting under tension hurts   This is a new problem for her  The left shoulder may pop sometimes but not painful She was seen by ortho back in [place] in the past   There are no problems to display for this patient.   Past Medical History:  Diagnosis Date   Allergy    Asthma    Hypertension     Past Surgical History:  Procedure Laterality Date   BACK SURGERY  02/2007   Plate and 2 screws W9/V9   BRANCHIAL CLEFT CYST EXCISION  03/2015   Two more removed: 09/2020, 03/2015   DILATION AND CURETTAGE OF UTERUS  12/1996   KNEE SURGERY Right 05/2014   Torn Meniscus    Social History   Tobacco Use   Smoking status: Every Day    Types: Cigarettes   Smokeless tobacco: Never  Vaping Use   Vaping Use: Never used  Substance Use Topics   Alcohol use: Not Currently   Drug use: Never    Family History  Problem Relation Age of Onset   Stroke Mother    Hyperlipidemia Mother    Cancer Mother    Arthritis Mother     Heart disease Father    Cancer Father    Miscarriages / Stillbirths Sister    Asthma Sister    Esophageal cancer Maternal Grandmother    Colon cancer Paternal Grandfather    Heart disease Son        SVT   Mental illness Son    Colon polyps Neg Hx    Rectal cancer Neg Hx    Stomach cancer Neg Hx     No Known Allergies  Medication list has been reviewed and updated.  Current Outpatient Medications on File Prior to Visit  Medication Sig Dispense Refill   albuterol (VENTOLIN HFA) 108 (90 Base) MCG/ACT inhaler Inhale 2 puffs into the lungs every 4 (four) hours as needed.     buPROPion (WELLBUTRIN XL) 150 MG 24 hr tablet Take 1 tablet (150 mg total) by mouth daily. 90 tablet 3   Cholecalciferol (VITAMIN D3 PO) Take by mouth.     Collagen-Boron-Hyaluronic Acid (MOVE FREE ULTRA JOINT HEALTH PO) Take by mouth.     ELDERBERRY PO Take by mouth.     fluticasone (FLONASE) 50 MCG/ACT nasal spray Place into both nostrils  daily.     pantoprazole (PROTONIX) 40 MG tablet Take 1 tablet (40 mg total) by mouth daily. Take for 2-4 weeks as needed 30 tablet 3   Prenatal Vit-Fe Fumarate-FA (M-VIT) tablet Take 1 tablet by mouth daily.     traZODone (DESYREL) 50 MG tablet Take 0.5-1 tablets (25-50 mg total) by mouth at bedtime as needed for sleep. 30 tablet 3   vitamin C (ASCORBIC ACID) 250 MG tablet Take 250 mg by mouth daily.     No current facility-administered medications on file prior to visit.    Review of Systems:  As per HPI- otherwise negative.   Physical Examination: Vitals:   09/27/22 1549  BP: 110/70  Pulse: 92  Resp: 18  Temp: 97.8 F (36.6 C)  SpO2: 96%   Vitals:   09/27/22 1549  Height: '5\' 7"'$  (1.702 m)   Body mass index is 33.61 kg/m. Ideal Body Weight: Weight in (lb) to have BMI = 25: 159.3  GEN: no acute distress.  Obese, looks well  HEENT: Atraumatic, Normocephalic.  Ears and Nose: No external deformity. CV: RRR, No M/G/R. No JVD. No thrill. No extra heart  sounds. PULM: CTA B, no wheezes, crackles, rhonchi. No retractions. No resp. distress. No accessory muscle use. EXTR: No c/c/e PSYCH: Normally interactive. Conversant.  Right shoulder: Pain with flexion past 120 Pain with abduction past 90 Reduced internal rotation + Neer's testing Normal strength Normal sensation and normal biceps DTR bilaterally    Assessment and Plan: Right rotator cuff tendinitis - Plan: Ambulatory referral to Orthopedic Surgery, meloxicam (MOBIC) 15 MG tablet  Primary insomnia - Plan: traZODone (DESYREL) 100 MG tablet  Acute pain of right shoulder - Plan: Ambulatory referral to Orthopedic Surgery, meloxicam (MOBIC) 15 MG tablet  Seen today with right sided RC tendonitis Mobic Referral to ortho Home exercise program  She notes difficulty staying asleep with 50 of trazodone- will increase to 100 mg as needed  Signed Lamar Blinks, MD

## 2022-09-27 ENCOUNTER — Ambulatory Visit: Payer: BC Managed Care – PPO | Admitting: Family Medicine

## 2022-09-27 VITALS — BP 110/70 | HR 92 | Temp 97.8°F | Resp 18 | Ht 67.0 in | Wt 214.0 lb

## 2022-09-27 DIAGNOSIS — M7581 Other shoulder lesions, right shoulder: Secondary | ICD-10-CM | POA: Diagnosis not present

## 2022-09-27 DIAGNOSIS — F5101 Primary insomnia: Secondary | ICD-10-CM | POA: Diagnosis not present

## 2022-09-27 DIAGNOSIS — M25511 Pain in right shoulder: Secondary | ICD-10-CM

## 2022-09-27 MED ORDER — MELOXICAM 15 MG PO TABS: ORAL_TABLET | ORAL | 1 refills | Status: DC

## 2022-09-27 MED ORDER — TRAZODONE HCL 100 MG PO TABS: 100.0000 mg | ORAL_TABLET | Freq: Every evening | ORAL | 3 refills | Status: DC | PRN

## 2022-09-27 NOTE — Patient Instructions (Signed)
It was good to see you again today I think you have rotator cuff tendonitis Referral to ortho at [facility]  Work on exercises on hand-out https://orthoinfo.aaos.org/globalassets/pdfs/2017-rehab_shoulder.pdf  Meloxicam once daily as needed  Try increasing your trazodone to 100 mg at bedtime for sleep

## 2022-10-13 DIAGNOSIS — M25511 Pain in right shoulder: Secondary | ICD-10-CM | POA: Diagnosis not present

## 2022-11-16 ENCOUNTER — Other Ambulatory Visit: Payer: Self-pay | Admitting: Family Medicine

## 2022-11-16 DIAGNOSIS — K219 Gastro-esophageal reflux disease without esophagitis: Secondary | ICD-10-CM

## 2022-12-10 ENCOUNTER — Other Ambulatory Visit: Payer: Self-pay | Admitting: Family Medicine

## 2022-12-10 DIAGNOSIS — M7581 Other shoulder lesions, right shoulder: Secondary | ICD-10-CM

## 2022-12-10 DIAGNOSIS — M25511 Pain in right shoulder: Secondary | ICD-10-CM

## 2023-02-16 DIAGNOSIS — N920 Excessive and frequent menstruation with regular cycle: Secondary | ICD-10-CM | POA: Diagnosis not present

## 2023-02-16 DIAGNOSIS — Z124 Encounter for screening for malignant neoplasm of cervix: Secondary | ICD-10-CM | POA: Diagnosis not present

## 2023-02-16 DIAGNOSIS — Z01419 Encounter for gynecological examination (general) (routine) without abnormal findings: Secondary | ICD-10-CM | POA: Diagnosis not present

## 2023-02-17 DIAGNOSIS — Z124 Encounter for screening for malignant neoplasm of cervix: Secondary | ICD-10-CM | POA: Diagnosis not present

## 2023-02-17 LAB — HM PAP SMEAR: HPV, high-risk: NEGATIVE

## 2023-02-17 LAB — RESULTS CONSOLE HPV: CHL HPV: NEGATIVE

## 2023-04-08 IMAGING — DX DG LUMBAR SPINE COMPLETE 4+V
5 series · 5 of 5 positions shown · non-contrast
Comparison: None.

CLINICAL DATA: Low back pain

EXAM:
LUMBAR SPINE - COMPLETE 4+ VIEW

[l-spine ap]
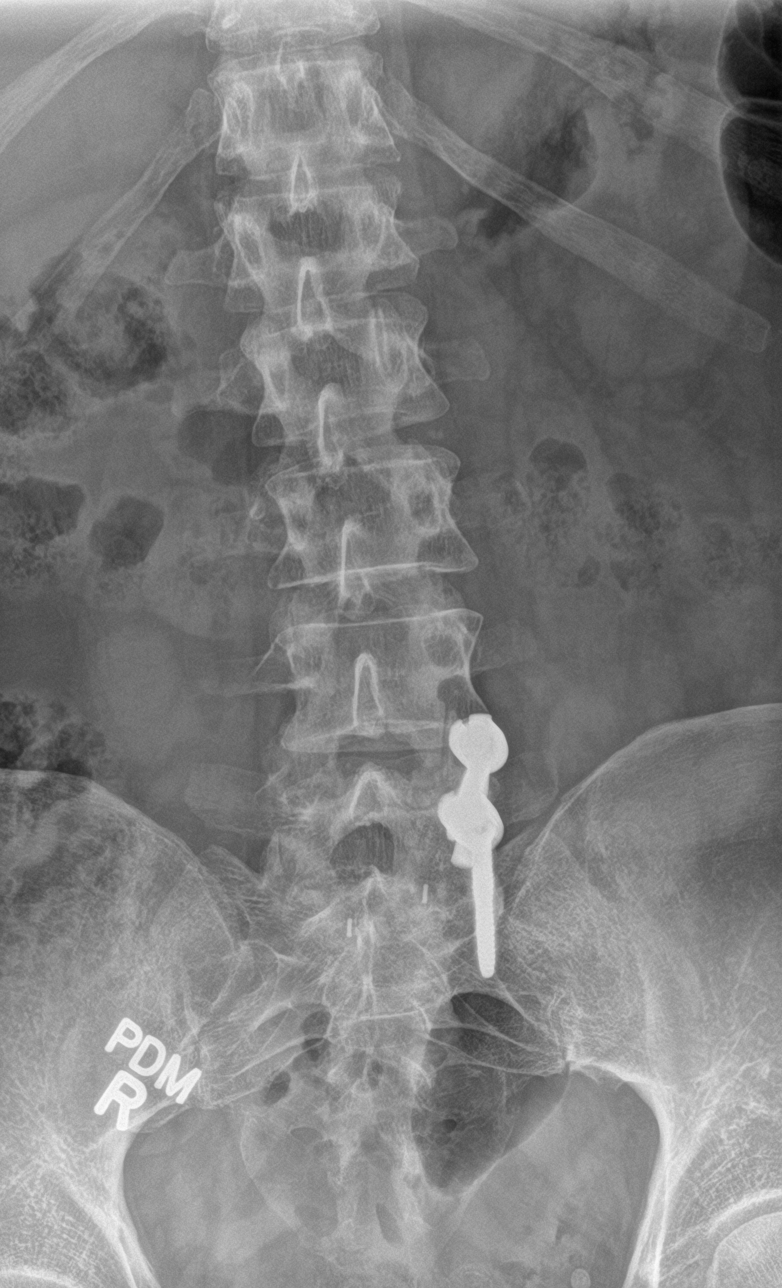

[l-spine obl (1 of 2)]
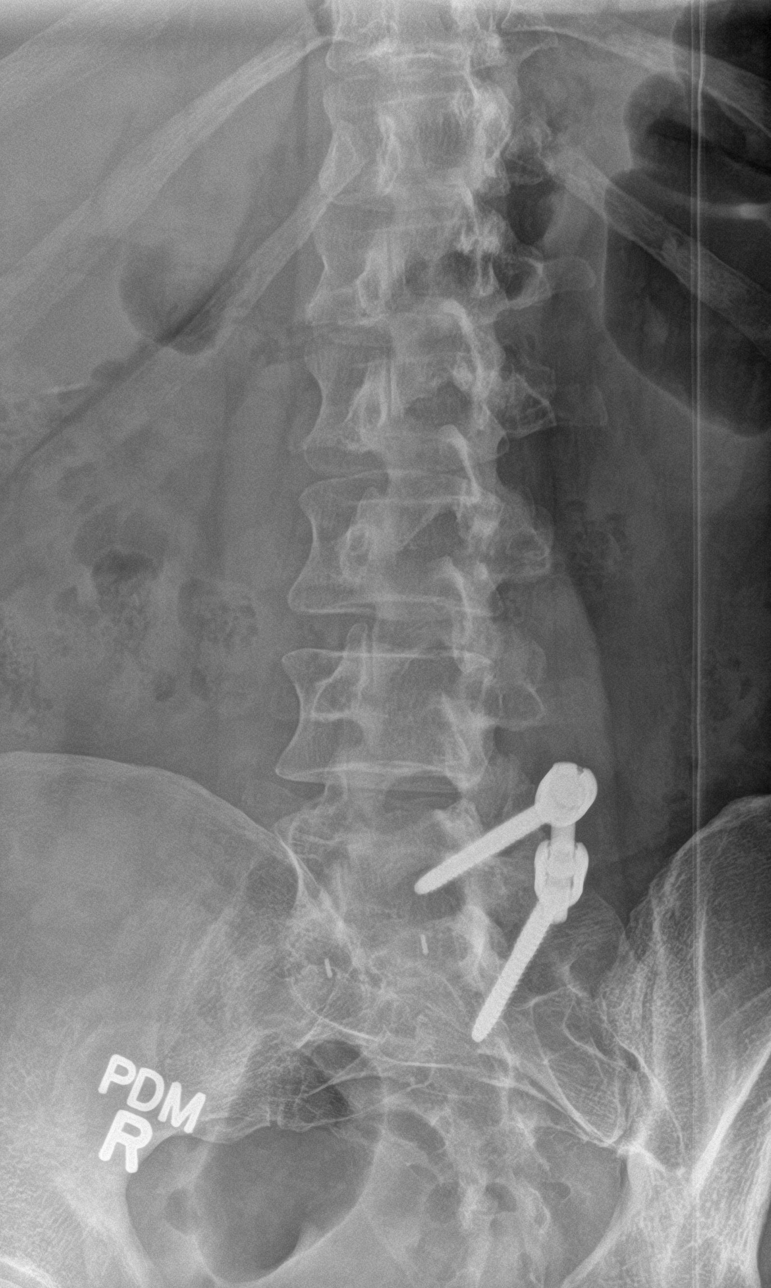

[l-spine obl (2 of 2)]
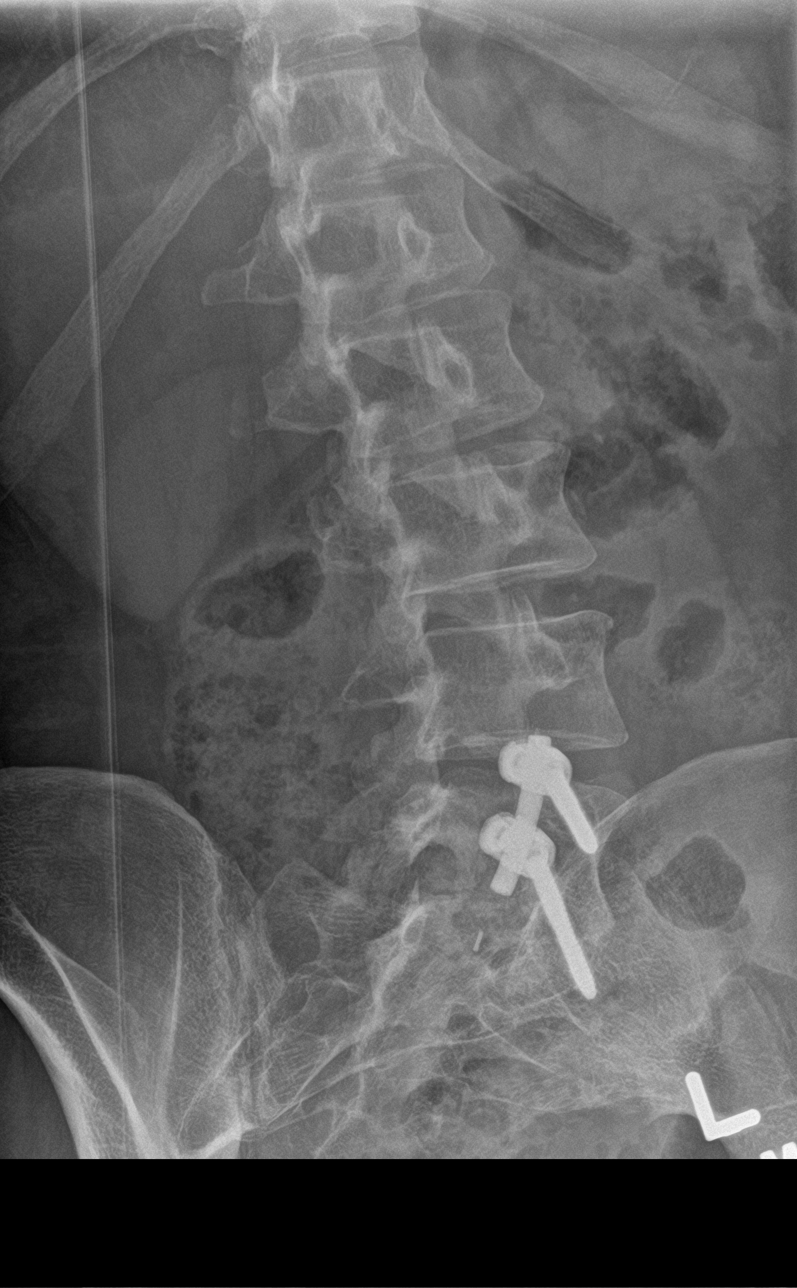

[l-spine lat]
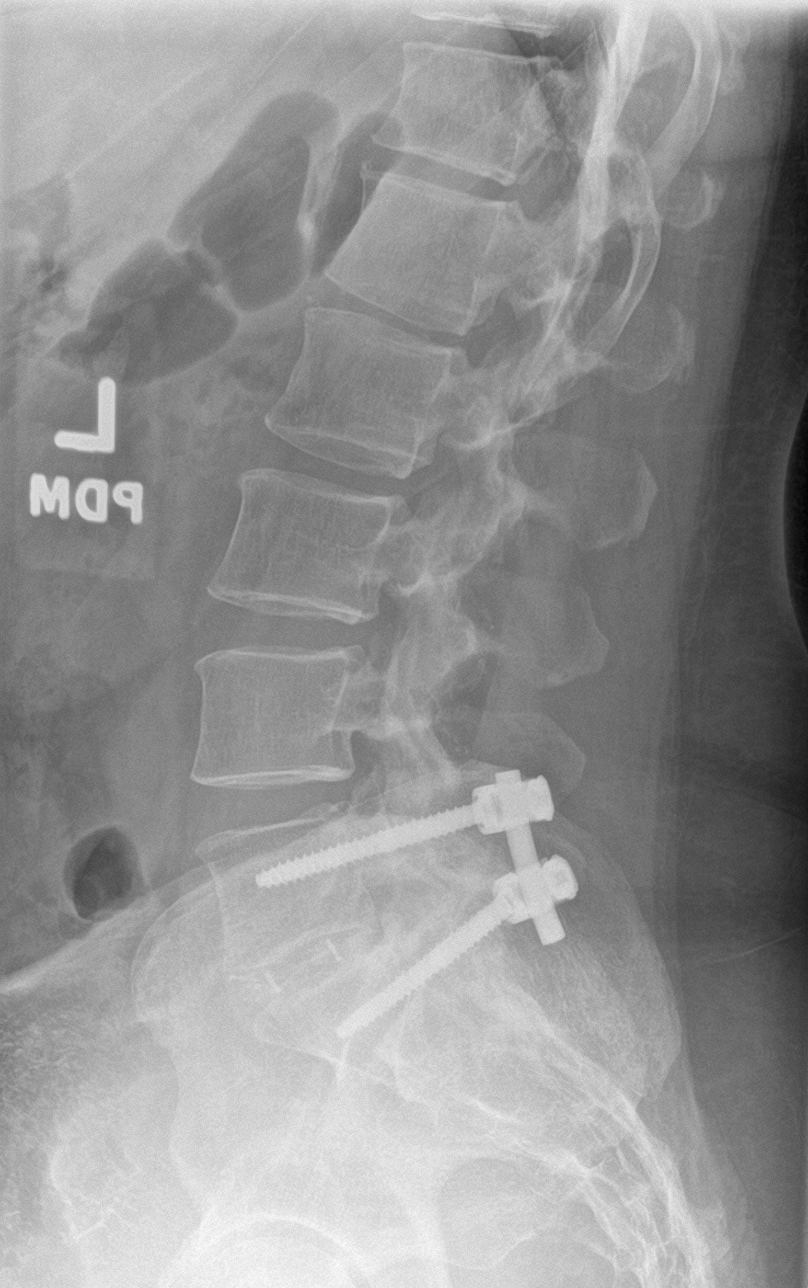

[l-spine spot]
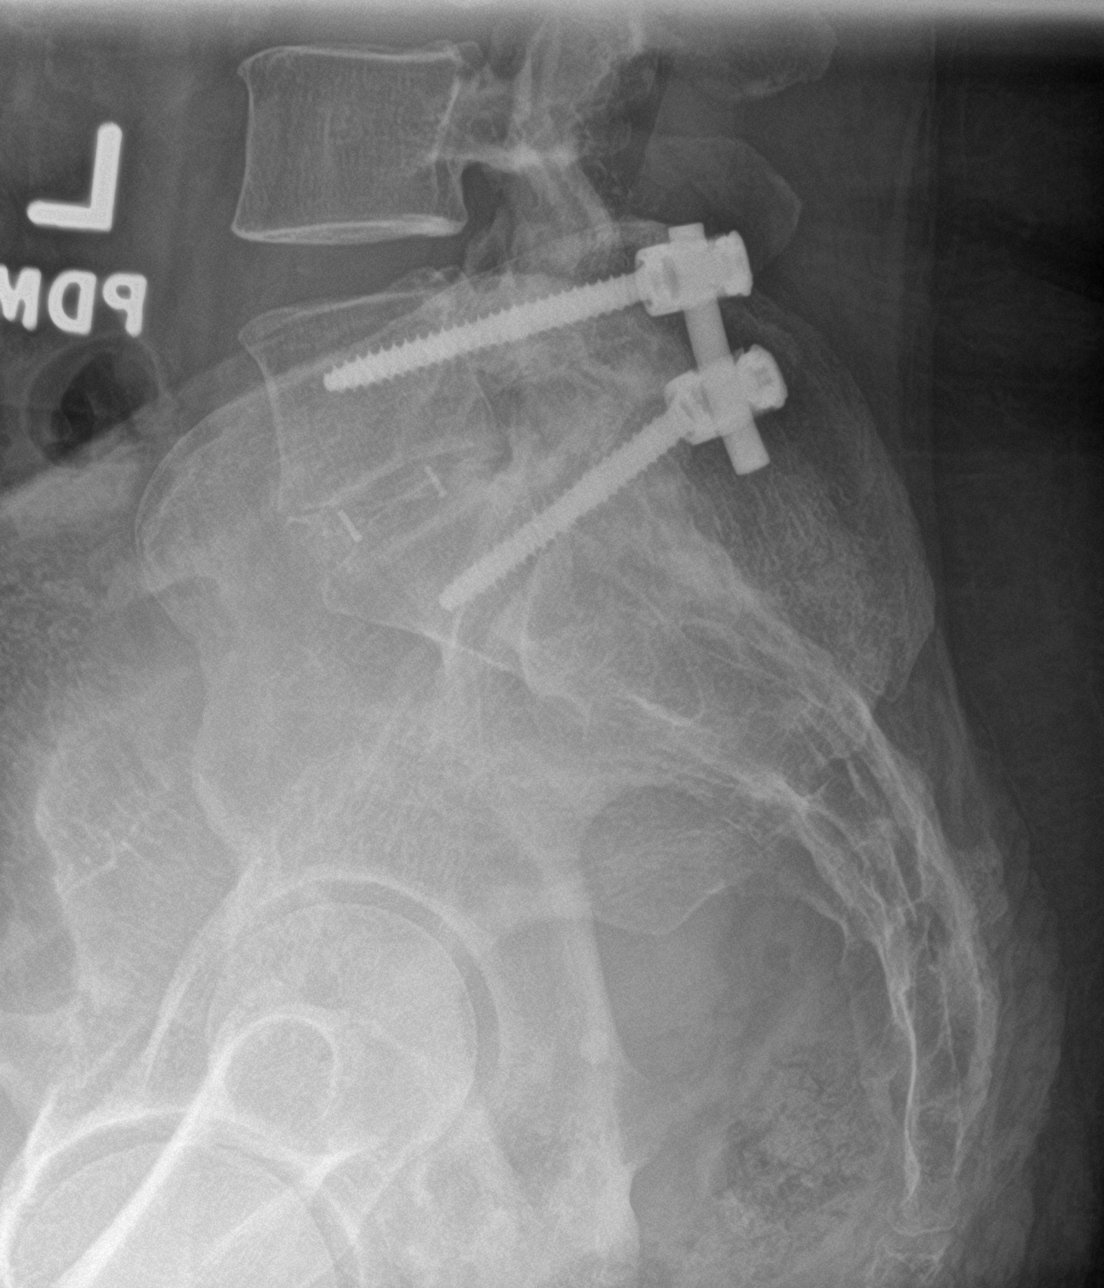

[5 of 5 positions shown; findings below may reference images not displayed]

FINDINGS: No evidence of lumbar spine fracture. Prior left posterior fusion of
L5-S1. Mild multilevel degenerative disc disease consisting of
osteophyte formation. Disc spaces are relatively well maintained.
Mild multilevel facet arthropathy.
IMPRESSION: No acute osseous abnormality.

## 2023-08-15 ENCOUNTER — Other Ambulatory Visit: Payer: Self-pay | Admitting: Family Medicine

## 2023-08-15 DIAGNOSIS — Z720 Tobacco use: Secondary | ICD-10-CM

## 2023-11-13 ENCOUNTER — Other Ambulatory Visit: Payer: Self-pay | Admitting: Family Medicine

## 2023-11-13 DIAGNOSIS — Z720 Tobacco use: Secondary | ICD-10-CM

## 2024-01-04 ENCOUNTER — Other Ambulatory Visit: Payer: Self-pay | Admitting: Family Medicine

## 2024-01-04 DIAGNOSIS — Z720 Tobacco use: Secondary | ICD-10-CM

## 2024-03-13 ENCOUNTER — Other Ambulatory Visit: Payer: Self-pay | Admitting: Radiology

## 2024-03-13 LAB — HM MAMMOGRAPHY

## 2024-03-14 LAB — SURGICAL PATHOLOGY

## 2024-03-16 ENCOUNTER — Other Ambulatory Visit: Payer: Self-pay | Admitting: Family Medicine

## 2024-03-16 DIAGNOSIS — Z720 Tobacco use: Secondary | ICD-10-CM

## 2024-03-16 MED ORDER — BUPROPION HCL ER (XL) 150 MG PO TB24: 150.0000 mg | ORAL_TABLET | Freq: Every day | ORAL | 0 refills | Status: DC

## 2024-03-31 NOTE — Progress Notes (Addendum)
 [facility] at [facility] [address], [address] [place], [place] [address] [phone]  Date:  04/02/2024   Name:  Sandra Hester   DOB:  [birth year]   MRN:  [id number]  PCP:  Kaylee Partridge, MD    Chief Complaint: Medical Management of Chronic Issues   History of Present Illness:  Sandra Hester is a 48 y.o. very pleasant female patient who presents with the following:  Patient seen today for physical and discussion of menopause symptoms Most recent visit with myself was in late 2023  GYN care [facility]  Pap smear- Dr Renea Carrion at GYN office Mammogram- done at her GYN, she had a bx on 4/29.  She had a benign finding which will be monitored  Colon cancer screening- she is due for a 3 years recheck this October  Can update lab work today  She is still smoking- wellbutrin  does help her smoke less  It also helps with her mood.  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   Patient Active Problem List   Diagnosis Date Noted   Tobacco use 04/02/2024   Primary insomnia 04/02/2024    Past Medical History:  Diagnosis Date   Allergy    Asthma    Hypertension     Past Surgical History:  Procedure Laterality Date   BACK SURGERY  02/2007   Plate and 2 screws L5/S1   BRANCHIAL CLEFT CYST EXCISION  03/2015   Two more removed: 09/2020, 03/2015   DILATION AND CURETTAGE OF UTERUS  12/1996   KNEE SURGERY Right 05/2014   Torn Meniscus    Social History   Tobacco Use   Smoking status: Every Day    Types: Cigarettes   Smokeless tobacco: Never  Vaping Use   Vaping  status: Never Used  Substance Use Topics   Alcohol use: Not Currently   Drug use: Never    Family History  Problem Relation Age of Onset   Stroke Mother    Hyperlipidemia Mother    Cancer Mother    Arthritis Mother    Heart disease Father    Cancer Father    Miscarriages / Stillbirths Sister    Asthma Sister    Esophageal cancer Maternal Grandmother    Colon cancer Paternal Grandfather    Heart disease Son        SVT   Mental illness Son    Colon polyps Neg Hx    Rectal cancer Neg Hx    Stomach cancer Neg Hx     Allergies  Allergen Reactions   Bee Venom Hives and Nausea And Vomiting   Wound Dressing Adhesive Itching and Rash    Medication list has been reviewed and updated.  Current Outpatient Medications on File Prior to Visit  Medication Sig Dispense Refill   Cholecalciferol (VITAMIN D3 PO) Take by mouth.     Collagen-Boron-Hyaluronic Acid (MOVE FREE ULTRA JOINT HEALTH PO) Take by mouth.     ELDERBERRY PO Take by mouth.     fluticasone (FLONASE) 50 MCG/ACT  nasal spray Place into both nostrils daily.     Prenatal Vit-Fe Fumarate-FA (M-VIT) tablet Take 1 tablet by mouth daily.     No current facility-administered medications on file prior to visit.    Review of Systems:  As per HPI- otherwise negative.   Physical Examination: Vitals:   04/02/24 1552  BP: 110/76  Pulse: 86  SpO2: 97%   Vitals:   04/02/24 1552  Weight: 209 lb (94.8 kg)  Height: 5\' 7"  (1.702 m)   Body mass index is 32.73 kg/m. Ideal Body Weight: Weight in (lb) to have BMI = 25: 159.3  GEN: no acute distress.  Mildly obese, looks well HEENT: Atraumatic, Normocephalic.  Bilateral TM wnl, oropharynx normal.  PEERL,EOMI.   Ears and Nose: No external deformity. CV: RRR, No M/G/R. No JVD. No thrill. No extra heart sounds. PULM: CTA B, no wheezes, crackles, rhonchi. No retractions. No resp. distress. No accessory muscle use. ABD: S, NT, ND No rebound. No HSM. EXTR: No c/c/e PSYCH:  Normally interactive. Conversant.    Assessment and Plan: Physical exam  Tobacco use - Plan: buPROPion  (WELLBUTRIN  XL) 150 MG 24 hr tablet  Primary insomnia - Plan: traZODone  (DESYREL ) 100 MG tablet  Screening for diabetes mellitus - Plan: Comprehensive metabolic panel with GFR, Hemoglobin A1c  Screening, lipid - Plan: Lipid panel  Thyroid  disorder screening - Plan: TSH  Screening for deficiency anemia - Plan: CBC  Fatigue, unspecified type - Plan: CBC, TSH, VITAMIN D  25 Hydroxy (Vit-D Deficiency, Fractures)  Physical exam today.  Encouraged healthy diet and exercise routine Discussed screening for lung cancer starting next year Reminded her that 3-year colonoscopy is due this fall, she will contact gastroenterology Refilled medications, labs are pending as above Will plan further follow- up pending labs.  Signed Gates Kasal, MD  Addendum 5/20, received labs as below.  Message to patient  Results for orders placed or performed in visit on 04/02/24  CBC   Collection Time: 04/02/24  4:16 PM  Result Value Ref Range   WBC 6.8 4.0 - 10.5 K/uL   RBC 4.81 3.87 - 5.11 Mil/uL   Platelets 212.0 150.0 - 400.0 K/uL   Hemoglobin 14.0 12.0 - 15.0 g/dL   HCT 44.0 34.7 - 42.5 %   MCV 86.6 78.0 - 100.0 fl   MCHC 33.7 30.0 - 36.0 g/dL   RDW 95.6 38.7 - 56.4 %  Comprehensive metabolic panel with GFR   Collection Time: 04/02/24  4:16 PM  Result Value Ref Range   Sodium 137 135 - 145 mEq/L   Potassium 4.3 3.5 - 5.1 mEq/L   Chloride 105 96 - 112 mEq/L   CO2 26 19 - 32 mEq/L   Glucose, Bld 90 70 - 99 mg/dL   BUN 11 6 - 23 mg/dL   Creatinine, Ser 3.32 0.40 - 1.20 mg/dL   Total Bilirubin 0.4 0.2 - 1.2 mg/dL   Alkaline Phosphatase 85 39 - 117 U/L   AST 14 0 - 37 U/L   ALT 10 0 - 35 U/L   Total Protein 6.9 6.0 - 8.3 g/dL   Albumin 4.4 3.5 - 5.2 g/dL   GFR 95.18 >84.16 mL/min   Calcium 8.8 8.4 - 10.5 mg/dL  Hemoglobin S0Y   Collection Time: 04/02/24  4:16 PM  Result Value Ref  Range   Hgb A1c MFr Bld 5.5 4.6 - 6.5 %  Lipid panel   Collection Time: 04/02/24  4:16 PM  Result Value Ref Range   Cholesterol  154 0 - 200 mg/dL   Triglycerides 119.1 (H) 0.0 - 149.0 mg/dL   HDL 47.82 (L) >95.62 mg/dL   VLDL 13.0 0.0 - 86.5 mg/dL   LDL Cholesterol 83 0 - 99 mg/dL   Total CHOL/HDL Ratio 5    NonHDL 122.79   TSH   Collection Time: 04/02/24  4:16 PM  Result Value Ref Range   TSH 2.95 0.35 - 5.50 uIU/mL  VITAMIN D  25 Hydroxy (Vit-D Deficiency, Fractures)   Collection Time: 04/02/24  4:16 PM  Result Value Ref Range   VITD 52.97 30.00 - 100.00 ng/mL

## 2024-03-31 NOTE — Patient Instructions (Addendum)
 It was great to see you today, I will be in touch with your labs asap Start lung cancer screening CT next year- age 48 Please call GI; I believe you are due for a repeat colonoscopy this fall   Located in: Sandra Hester [address] Address: [address], [place], [place] [address] Phone: [phone]

## 2024-04-02 ENCOUNTER — Ambulatory Visit (INDEPENDENT_AMBULATORY_CARE_PROVIDER_SITE_OTHER): Admitting: Family Medicine

## 2024-04-02 VITALS — BP 110/76 | HR 86 | Ht 67.0 in | Wt 209.0 lb

## 2024-04-02 DIAGNOSIS — Z131 Encounter for screening for diabetes mellitus: Secondary | ICD-10-CM

## 2024-04-02 DIAGNOSIS — R5383 Other fatigue: Secondary | ICD-10-CM

## 2024-04-02 DIAGNOSIS — Z720 Tobacco use: Secondary | ICD-10-CM

## 2024-04-02 DIAGNOSIS — F5101 Primary insomnia: Secondary | ICD-10-CM | POA: Diagnosis not present

## 2024-04-02 DIAGNOSIS — Z1329 Encounter for screening for other suspected endocrine disorder: Secondary | ICD-10-CM | POA: Diagnosis not present

## 2024-04-02 DIAGNOSIS — Z0000 Encounter for general adult medical examination without abnormal findings: Secondary | ICD-10-CM | POA: Diagnosis not present

## 2024-04-02 DIAGNOSIS — Z13220 Encounter for screening for lipoid disorders: Secondary | ICD-10-CM | POA: Diagnosis not present

## 2024-04-02 DIAGNOSIS — Z130 Encounter for screening for diseases of the blood and blood-forming organs and certain disorders involving the immune mechanism: Secondary | ICD-10-CM | POA: Diagnosis not present

## 2024-04-02 MED ORDER — TRAZODONE HCL 100 MG PO TABS: 100.0000 mg | ORAL_TABLET | Freq: Every evening | ORAL | 0 refills | Status: DC | PRN

## 2024-04-02 MED ORDER — BUPROPION HCL ER (XL) 150 MG PO TB24: 150.0000 mg | ORAL_TABLET | Freq: Every day | ORAL | 3 refills | Status: DC

## 2024-04-03 ENCOUNTER — Encounter: Payer: Self-pay | Admitting: Family Medicine

## 2024-04-03 DIAGNOSIS — E785 Hyperlipidemia, unspecified: Secondary | ICD-10-CM

## 2024-04-03 LAB — CBC
HCT: 41.6 % (ref 36.0–46.0)
Hemoglobin: 14 g/dL (ref 12.0–15.0)
MCHC: 33.7 g/dL (ref 30.0–36.0)
MCV: 86.6 fl (ref 78.0–100.0)
Platelets: 212 10*3/uL (ref 150.0–400.0)
RBC: 4.81 Mil/uL (ref 3.87–5.11)
RDW: 13.5 % (ref 11.5–15.5)
WBC: 6.8 10*3/uL (ref 4.0–10.5)

## 2024-04-03 LAB — LIPID PANEL
Cholesterol: 154 mg/dL (ref 0–200)
HDL: 31.4 mg/dL — ABNORMAL LOW
LDL Cholesterol: 83 mg/dL (ref 0–99)
NonHDL: 122.79
Total CHOL/HDL Ratio: 5
Triglycerides: 197 mg/dL — ABNORMAL HIGH (ref 0.0–149.0)
VLDL: 39.4 mg/dL (ref 0.0–40.0)

## 2024-04-03 LAB — COMPREHENSIVE METABOLIC PANEL WITH GFR
ALT: 10 U/L (ref 0–35)
AST: 14 U/L (ref 0–37)
Albumin: 4.4 g/dL (ref 3.5–5.2)
Alkaline Phosphatase: 85 U/L (ref 39–117)
BUN: 11 mg/dL (ref 6–23)
CO2: 26 meq/L (ref 19–32)
Calcium: 8.8 mg/dL (ref 8.4–10.5)
Chloride: 105 meq/L (ref 96–112)
Creatinine, Ser: 0.85 mg/dL (ref 0.40–1.20)
GFR: 80.58 mL/min (ref >60.00)
Glucose, Bld: 90 mg/dL (ref 70–99)
Potassium: 4.3 meq/L (ref 3.5–5.1)
Sodium: 137 meq/L (ref 135–145)
Total Bilirubin: 0.4 mg/dL (ref 0.2–1.2)
Total Protein: 6.9 g/dL (ref 6.0–8.3)

## 2024-04-03 LAB — VITAMIN D 25 HYDROXY (VIT D DEFICIENCY, FRACTURES): VITD: 52.97 ng/mL (ref 30.00–100.00)

## 2024-04-03 LAB — TSH: TSH: 2.95 u[IU]/mL (ref 0.35–5.50)

## 2024-04-03 LAB — HEMOGLOBIN A1C: Hgb A1c MFr Bld: 5.5 % (ref 4.6–6.5)

## 2024-04-04 MED ORDER — ROSUVASTATIN CALCIUM 10 MG PO TABS
10.0000 mg | ORAL_TABLET | Freq: Every day | ORAL | 3 refills | Status: DC
Start: 2024-04-04 — End: 2024-04-19

## 2024-04-16 ENCOUNTER — Encounter: Payer: Self-pay | Admitting: Family Medicine

## 2024-04-19 ENCOUNTER — Encounter: Payer: Self-pay | Admitting: Family Medicine

## 2024-04-19 MED ORDER — PRAVASTATIN SODIUM 20 MG PO TABS: 20.0000 mg | ORAL_TABLET | Freq: Every day | ORAL | 1 refills | Status: DC

## 2024-04-19 NOTE — Addendum Note (Signed)
 Addended by: Gates Kasal C on: 04/19/2024 02:14 PM   Modules accepted: Orders

## 2024-07-14 ENCOUNTER — Other Ambulatory Visit: Payer: Self-pay | Admitting: Family Medicine

## 2024-07-14 DIAGNOSIS — E785 Hyperlipidemia, unspecified: Secondary | ICD-10-CM

## 2024-11-23 ENCOUNTER — Other Ambulatory Visit: Payer: Self-pay | Admitting: Family Medicine

## 2024-11-23 DIAGNOSIS — E785 Hyperlipidemia, unspecified: Secondary | ICD-10-CM

## 2024-11-24 NOTE — Progress Notes (Unsigned)
 [facility] [address] [place], [place] [address] [phone]  Date:  11/26/2024   Name:  Sandra Hester   DOB:  19-Mar-1976   MRN:  [id number]  PCP:  Watt Harlene BROCKS, MD    Chief Complaint: No chief complaint on file.   History of Present Illness:  Sandra Hester is a 49 y.o. very pleasant female patient who presents with the following:  Patient seen today for medication follow-up and Pap smear.  I saw her most recently in May for her physical exam At her last visit we noted dyslipidemia, I had her try Crestor  but she was bothered by side effects so we switched to pravastatin  We can recheck her lipids today  Discussed the use of AI scribe software for clinical note transcription with the patient, who gave verbal consent to proceed.  History of Present Illness      Patient Active Problem List   Diagnosis Date Noted   Tobacco use 04/02/2024   Primary insomnia 04/02/2024    Past Medical History:  Diagnosis Date   Allergy    Asthma    Hypertension     Past Surgical History:  Procedure Laterality Date   BACK SURGERY  02/2007   Plate and 2 screws L5/S1   BRANCHIAL CLEFT CYST EXCISION  03/2015   Two more removed: 09/2020, 03/2015   DILATION AND CURETTAGE OF UTERUS  12/1996   KNEE SURGERY Right 05/2014   Torn Meniscus    Social History[1]  Family History  Problem Relation Age of Onset   Stroke Mother    Hyperlipidemia Mother    Cancer Mother    Arthritis Mother    Heart disease Father    Cancer Father    Miscarriages / Stillbirths Sister    Asthma Sister    Esophageal cancer Maternal Grandmother    Colon cancer Paternal Grandfather    Heart disease Son        SVT   Mental illness Son    Colon polyps Neg Hx    Rectal cancer Neg Hx    Stomach cancer Neg Hx     Allergies[2]  Medication list has been reviewed and updated.  Medications Ordered Prior to Encounter[3]  Review of Systems:  As  per HPI- otherwise negative.   Physical Examination: There were no vitals filed for this visit. There were no vitals filed for this visit. There is no height or weight on file to calculate BMI. Ideal Body Weight:    GEN: no acute distress. HEENT: Atraumatic, Normocephalic.  Ears and Nose: No external deformity. CV: RRR, No M/G/R. No JVD. No thrill. No extra heart sounds. PULM: CTA B, no wheezes, crackles, rhonchi. No retractions. No resp. distress. No accessory muscle use. ABD: S, NT, ND, +BS. No rebound. No HSM. EXTR: No c/c/e PSYCH: Normally interactive. Conversant.    Assessment and Plan: No diagnosis found.  Assessment & Plan   Signed Harlene Watt, MD    [1]  Social History Tobacco Use   Smoking status: Every Day    Types: Cigarettes   Smokeless tobacco: Never  Vaping Use   Vaping status: Never Used  Substance Use Topics   Alcohol use: Not Currently   Drug use: Never  [2]  Allergies Allergen Reactions   Bee Venom Hives and Nausea And Vomiting   Wound Dressing Adhesive Itching and Rash  [3]  Current Outpatient Medications on File Prior to Visit  Medication Sig Dispense Refill   buPROPion  (WELLBUTRIN  XL) 150 MG 24 hr tablet Take 1 tablet (150 mg total) by mouth daily. 90 tablet 3   Cholecalciferol (VITAMIN D3 PO) Take by mouth.     Collagen-Boron-Hyaluronic Acid (MOVE FREE ULTRA JOINT HEALTH PO) Take by mouth.     ELDERBERRY PO Take by mouth.     fluticasone (FLONASE) 50 MCG/ACT nasal spray Place into both nostrils daily.     pravastatin  (PRAVACHOL ) 20 MG tablet Take 1 tablet (20 mg total) by mouth daily. Due for appt 30 tablet 0   Prenatal Vit-Fe Fumarate-FA (M-VIT) tablet Take 1 tablet by mouth daily.     traZODone  (DESYREL ) 100 MG tablet Take 1 tablet (100 mg total) by mouth at bedtime as needed for sleep. 90 tablet 0   No current facility-administered medications on file prior to visit.   "

## 2024-11-26 ENCOUNTER — Ambulatory Visit: Admitting: Family Medicine

## 2024-11-26 ENCOUNTER — Encounter: Payer: Self-pay | Admitting: Family Medicine

## 2024-11-26 DIAGNOSIS — K219 Gastro-esophageal reflux disease without esophagitis: Secondary | ICD-10-CM | POA: Diagnosis not present

## 2024-11-26 DIAGNOSIS — E785 Hyperlipidemia, unspecified: Secondary | ICD-10-CM

## 2024-11-26 DIAGNOSIS — F5101 Primary insomnia: Secondary | ICD-10-CM

## 2024-11-26 DIAGNOSIS — Z720 Tobacco use: Secondary | ICD-10-CM | POA: Diagnosis not present

## 2024-11-26 MED ORDER — BUPROPION HCL ER (XL) 150 MG PO TB24: 150.0000 mg | ORAL_TABLET | Freq: Every day | ORAL | 3 refills | Status: AC

## 2024-11-26 MED ORDER — PRAVASTATIN SODIUM 10 MG PO TABS: 10.0000 mg | ORAL_TABLET | Freq: Every day | ORAL | 3 refills | Status: AC

## 2024-11-26 MED ORDER — TRAZODONE HCL 100 MG PO TABS: 100.0000 mg | ORAL_TABLET | Freq: Every evening | ORAL | 3 refills | Status: AC | PRN

## 2024-11-26 MED ORDER — PANTOPRAZOLE SODIUM 40 MG PO TBEC: 40.0000 mg | DELAYED_RELEASE_TABLET | Freq: Every day | ORAL | 3 refills | Status: AC

## 2024-11-27 ENCOUNTER — Encounter: Payer: Self-pay | Admitting: Family Medicine

## 2024-11-27 LAB — HEPATIC FUNCTION PANEL
ALT: 14 U/L (ref 3–35)
AST: 14 U/L (ref 5–37)
Albumin: 4.4 g/dL (ref 3.5–5.2)
Alkaline Phosphatase: 89 U/L (ref 39–117)
Bilirubin, Direct: 0.1 mg/dL (ref 0.1–0.3)
Total Bilirubin: 0.4 mg/dL (ref 0.2–1.2)
Total Protein: 7.4 g/dL (ref 6.0–8.3)

## 2024-11-27 LAB — LIPID PANEL
Cholesterol: 139 mg/dL (ref 28–200)
HDL: 33.2 mg/dL — ABNORMAL LOW
LDL Cholesterol: 60 mg/dL (ref 10–99)
NonHDL: 106.03
Total CHOL/HDL Ratio: 4
Triglycerides: 231 mg/dL — ABNORMAL HIGH (ref 10.0–149.0)
VLDL: 46.2 mg/dL — ABNORMAL HIGH (ref 0.0–40.0)
